# Patient Record
Sex: Female | Born: 1960 | Race: White | Hispanic: No | Marital: Married | Smoking: Never smoker
Health system: Southern US, Community
[De-identification: ages and names within clinical notes are randomized; demographics above are authoritative.]

## PROBLEM LIST (undated history)

## (undated) DIAGNOSIS — M1990 Unspecified osteoarthritis, unspecified site: Secondary | ICD-10-CM

## (undated) DIAGNOSIS — N719 Inflammatory disease of uterus, unspecified: Secondary | ICD-10-CM

## (undated) DIAGNOSIS — E063 Autoimmune thyroiditis: Secondary | ICD-10-CM

## (undated) DIAGNOSIS — K219 Gastro-esophageal reflux disease without esophagitis: Secondary | ICD-10-CM

## (undated) DIAGNOSIS — Z803 Family history of malignant neoplasm of breast: Secondary | ICD-10-CM

## (undated) DIAGNOSIS — R638 Other symptoms and signs concerning food and fluid intake: Secondary | ICD-10-CM

## (undated) DIAGNOSIS — G8929 Other chronic pain: Secondary | ICD-10-CM

## (undated) DIAGNOSIS — M1711 Unilateral primary osteoarthritis, right knee: Secondary | ICD-10-CM

## (undated) DIAGNOSIS — O23599 Infection of other part of genital tract in pregnancy, unspecified trimester: Secondary | ICD-10-CM

## (undated) DIAGNOSIS — N952 Postmenopausal atrophic vaginitis: Secondary | ICD-10-CM

## (undated) DIAGNOSIS — M722 Plantar fascial fibromatosis: Secondary | ICD-10-CM

## (undated) DIAGNOSIS — R519 Headache, unspecified: Secondary | ICD-10-CM

## (undated) DIAGNOSIS — M1712 Unilateral primary osteoarthritis, left knee: Secondary | ICD-10-CM

## (undated) DIAGNOSIS — E039 Hypothyroidism, unspecified: Secondary | ICD-10-CM

## (undated) DIAGNOSIS — B001 Herpesviral vesicular dermatitis: Secondary | ICD-10-CM

## (undated) DIAGNOSIS — R51 Headache: Secondary | ICD-10-CM

## (undated) DIAGNOSIS — L739 Follicular disorder, unspecified: Secondary | ICD-10-CM

## (undated) DIAGNOSIS — E049 Nontoxic goiter, unspecified: Secondary | ICD-10-CM

## (undated) DIAGNOSIS — R011 Cardiac murmur, unspecified: Secondary | ICD-10-CM

## (undated) DIAGNOSIS — K5900 Constipation, unspecified: Secondary | ICD-10-CM

## (undated) DIAGNOSIS — Z780 Asymptomatic menopausal state: Secondary | ICD-10-CM

## (undated) HISTORY — DX: Autoimmune thyroiditis: E06.3

## (undated) HISTORY — DX: Plantar fascial fibromatosis: M72.2

## (undated) HISTORY — DX: Herpesviral vesicular dermatitis: B00.1

## (undated) HISTORY — DX: Other chronic pain: G89.29

## (undated) HISTORY — DX: Gastro-esophageal reflux disease without esophagitis: K21.9

## (undated) HISTORY — DX: Family history of malignant neoplasm of breast: Z80.3

## (undated) HISTORY — PX: GYNECOLOGIC CRYOSURGERY: SHX857

## (undated) HISTORY — DX: Unspecified osteoarthritis, unspecified site: M19.90

## (undated) HISTORY — DX: Nontoxic goiter, unspecified: E04.9

## (undated) HISTORY — DX: Follicular disorder, unspecified: L73.9

## (undated) HISTORY — DX: Headache, unspecified: R51.9

## (undated) HISTORY — DX: Headache: R51

## (undated) HISTORY — DX: Constipation, unspecified: K59.00

## (undated) HISTORY — PX: TONSILLECTOMY: SUR1361

## (undated) HISTORY — DX: Other symptoms and signs concerning food and fluid intake: R63.8

## (undated) HISTORY — DX: Asymptomatic menopausal state: Z78.0

## (undated) HISTORY — DX: Postmenopausal atrophic vaginitis: N95.2

---

## 2005-06-17 ENCOUNTER — Ambulatory Visit: Payer: Self-pay | Admitting: Unknown Physician Specialty

## 2006-08-18 ENCOUNTER — Ambulatory Visit: Payer: Self-pay | Admitting: Unknown Physician Specialty

## 2007-11-10 ENCOUNTER — Ambulatory Visit: Payer: Self-pay | Admitting: Unknown Physician Specialty

## 2008-05-18 ENCOUNTER — Ambulatory Visit: Payer: Self-pay | Admitting: Internal Medicine

## 2008-11-12 ENCOUNTER — Ambulatory Visit: Payer: Self-pay | Admitting: Unknown Physician Specialty

## 2009-12-09 ENCOUNTER — Ambulatory Visit: Payer: Self-pay | Admitting: Unknown Physician Specialty

## 2009-12-23 ENCOUNTER — Ambulatory Visit: Payer: Self-pay | Admitting: Unknown Physician Specialty

## 2011-01-15 ENCOUNTER — Ambulatory Visit: Payer: Self-pay | Admitting: Obstetrics and Gynecology

## 2012-01-19 ENCOUNTER — Ambulatory Visit: Payer: Self-pay | Admitting: Obstetrics and Gynecology

## 2013-01-19 ENCOUNTER — Ambulatory Visit: Payer: Self-pay | Admitting: Obstetrics and Gynecology

## 2013-12-12 LAB — HM PAP SMEAR: HM Pap smear: NEGATIVE

## 2014-01-22 ENCOUNTER — Ambulatory Visit: Payer: Self-pay | Admitting: Obstetrics and Gynecology

## 2014-12-28 ENCOUNTER — Other Ambulatory Visit: Payer: Self-pay | Admitting: Obstetrics and Gynecology

## 2014-12-28 DIAGNOSIS — Z1239 Encounter for other screening for malignant neoplasm of breast: Secondary | ICD-10-CM

## 2015-01-29 ENCOUNTER — Other Ambulatory Visit: Payer: Self-pay | Admitting: Obstetrics and Gynecology

## 2015-01-29 ENCOUNTER — Ambulatory Visit
Admission: RE | Admit: 2015-01-29 | Discharge: 2015-01-29 | Disposition: A | Discharge status: Home / Self Care | Payer: BLUE CROSS/BLUE SHIELD | Source: Ambulatory Visit | Attending: Obstetrics and Gynecology | Admitting: Obstetrics and Gynecology

## 2015-01-29 DIAGNOSIS — Z1231 Encounter for screening mammogram for malignant neoplasm of breast: Secondary | ICD-10-CM | POA: Insufficient documentation

## 2015-01-29 DIAGNOSIS — Z1239 Encounter for other screening for malignant neoplasm of breast: Secondary | ICD-10-CM

## 2015-01-29 LAB — HM MAMMOGRAPHY

## 2015-02-22 ENCOUNTER — Ambulatory Visit: Admission: RE | Admit: 2015-02-22 | Payer: 59 | Source: Ambulatory Visit | Admitting: Unknown Physician Specialty

## 2015-02-22 ENCOUNTER — Encounter: Admission: RE | Payer: Self-pay | Source: Ambulatory Visit

## 2015-02-22 SURGERY — COLONOSCOPY
Anesthesia: General

## 2015-03-11 ENCOUNTER — Telehealth: Payer: Self-pay | Admitting: Obstetrics and Gynecology

## 2015-03-11 NOTE — Telephone Encounter (Signed)
PT NEEDS REFILL ON NYSTATIN & TRIAMCINOLONE ACETONIDE OINTMENT.  [facility]

## 2015-03-15 MED ORDER — NYSTATIN-TRIAMCINOLONE 100000-0.1 UNIT/GM-% EX CREA: 1.0000 "application " | TOPICAL_CREAM | Freq: Two times a day (BID) | CUTANEOUS | Status: DC

## 2015-03-15 NOTE — Telephone Encounter (Signed)
Pt aware med erx. 

## 2015-03-21 ENCOUNTER — Other Ambulatory Visit: Payer: Self-pay

## 2015-03-21 DIAGNOSIS — R21 Rash and other nonspecific skin eruption: Secondary | ICD-10-CM

## 2015-03-21 MED ORDER — TRIAMCINOLONE ACETONIDE 0.1 % EX OINT: 1.0000 "application " | TOPICAL_OINTMENT | Freq: Two times a day (BID) | CUTANEOUS | Status: DC

## 2015-03-21 MED ORDER — NYSTATIN 100000 UNIT/GM EX OINT: 1.0000 "application " | TOPICAL_OINTMENT | Freq: Two times a day (BID) | CUTANEOUS | Status: DC

## 2015-03-21 NOTE — Telephone Encounter (Signed)
Nystatin-triamcinolone needed a PA. Sent rx in separately.

## 2015-12-24 ENCOUNTER — Encounter: Payer: Self-pay | Admitting: Obstetrics and Gynecology

## 2015-12-24 ENCOUNTER — Ambulatory Visit (INDEPENDENT_AMBULATORY_CARE_PROVIDER_SITE_OTHER): Payer: 59 | Admitting: Obstetrics and Gynecology

## 2015-12-24 VITALS — BP 104/71 | HR 79 | Ht 66.0 in | Wt 180.9 lb

## 2015-12-24 DIAGNOSIS — Z0000 Encounter for general adult medical examination without abnormal findings: Secondary | ICD-10-CM

## 2015-12-24 DIAGNOSIS — Z1239 Encounter for other screening for malignant neoplasm of breast: Secondary | ICD-10-CM

## 2015-12-24 DIAGNOSIS — Z87410 Personal history of cervical dysplasia: Secondary | ICD-10-CM

## 2015-12-24 DIAGNOSIS — Z780 Asymptomatic menopausal state: Secondary | ICD-10-CM | POA: Diagnosis not present

## 2015-12-24 DIAGNOSIS — R638 Other symptoms and signs concerning food and fluid intake: Secondary | ICD-10-CM | POA: Diagnosis not present

## 2015-12-24 DIAGNOSIS — Z01419 Encounter for gynecological examination (general) (routine) without abnormal findings: Secondary | ICD-10-CM

## 2015-12-24 DIAGNOSIS — N952 Postmenopausal atrophic vaginitis: Secondary | ICD-10-CM

## 2015-12-24 NOTE — Addendum Note (Signed)
Addended by: Marchelle FolksMILLER, Brinlyn Cena G on: 12/24/2015 09:27 AM   Modules accepted: Orders

## 2015-12-24 NOTE — Patient Instructions (Signed)
1. Pap smear is done 2. Mammogram is ordered 3. Continue with healthy eating and exercise with weight loss 4. Encourage calcium with vitamin D supplementation 5. Return in 1 year

## 2015-12-24 NOTE — Progress Notes (Signed)
Patient ID: Adrienne Horne, female   DOB: August 18, 1961, 55 y.o.   MRN: [id number] ANNUAL PREVENTATIVE CARE GYN  ENCOUNTER NOTE  Subjective:       Adrienne Horne is a 55 y.o. [phone] female here for a routine annual gynecologic exam.  Current complaints: 1.  none   History of vaginal atrophy; not currently use lubricants. Patient does report some warmth in her left lower leg when walking on the anterior medial aspect. No redness or swelling is appreciated. No pain with dorsiflexion of fluid is noted. Patient desires repeat Pap smear testing this year because of strong family history of cancer. Colonoscopy in 2016-normal  Gynecologic History No LMP recorded. Patient is postmenopausal. Contraception: vasectomy Last Pap: 2015 neg/neg. Results were: normal Last mammogram: 01/2015 birad 1. Results were: normal  Obstetric History OB History  Gravida Para Term Preterm AB SAB TAB Ectopic Multiple Living  # Outcome Date GA Lbr Len/2nd Weight Sex Delivery Anes PTL Lv  2 Term    7 lb 8 oz (3.402 kg) F Vag-Spont   Y     Complications: Endometritis affecting pregnancy  1 Term    5 lb 9.6 oz (2.54 kg) F Vag-Spont   Y      Past Medical History  Diagnosis Date  . Chronic left-sided headaches     UNCLEAR ETIOLOGY  . GERD (gastroesophageal reflux disease)   . Fever blister   . Plantar fasciitis   . Folliculitis   . Vaginal atrophy   . Menopause   . Family history of breast cancer   . Goiter   . Hashimoto's disease   . Constipation   . Increased BMI     Past Surgical History  Procedure Laterality Date  . Tonsillectomy    . Gynecologic cryosurgery      Current Outpatient Prescriptions on File Prior to Visit  Medication Sig Dispense Refill  . fluticasone (FLONASE) 50 MCG/ACT nasal spray Place 2 sprays into the nose daily as needed for rhinitis.     Marland Kitchen HYDROcodone-ibuprofen (VICOPROFEN) 7.5-200 MG per tablet Take 1 tablet by mouth 2 (two) times daily as needed for  severe pain.     Marland Kitchen ibuprofen (ADVIL,MOTRIN) 200 MG tablet Take 200 tablets by mouth daily.    Marland Kitchen nystatin ointment (MYCOSTATIN) Apply 1 application topically 2 (two) times daily. 30 g 0  . nystatin-triamcinolone (MYCOLOG II) cream Apply 1 application topically 2 (two) times daily. 30 g 0  . pantoprazole (PROTONIX) 40 MG tablet Take 1 tablet by mouth daily.    . polyethylene glycol powder (GLYCOLAX/MIRALAX) powder Take 1 Container by mouth once.    . triamcinolone ointment (KENALOG) 0.1 % Apply 1 application topically 2 (two) times daily. 30 g 0  . valACYclovir (VALTREX) 1000 MG tablet Take 2 tablets by mouth 2 (two) times daily as needed (cold sores).      No current facility-administered medications on file prior to visit.    Allergies  Allergen Reactions  . Penicillin G Rash    In high doses.  Ok to take amoxicillin    Social History   Social History  . Marital Status: Married    Spouse Name: N/A  . Number of Children: N/A  . Years of Education: N/A   Occupational History  . Not on file.   Social History Main Topics  . Smoking status: Never Smoker   . Smokeless tobacco: Not on file  .  Alcohol Use: No  . Drug Use: No  . Sexual Activity: Not Currently    Birth Control/ Protection: Other-see comments     Comment: VASECTOMY   Other Topics Concern  . Not on file   Social History Narrative    Family History  Problem Relation Age of Onset  . Breast cancer Mother [id number]  . Heart disease Mother   . Pancreatic cancer Father   . Hypothyroidism Sister   . Diabetes Neg Hx   . Colon cancer Neg Hx   . Ovarian cancer Neg Hx     The following portions of the patient's history were reviewed and updated as appropriate: allergies, current medications, past family history, past medical history, past social history, past surgical history and problem list.  Review of Systems ROS Review of Systems - General ROS: negative for - chills, fatigue, fever, hot flashes, night sweats, weight  gain or weight loss Psychological ROS: negative for - anxiety, decreased libido, depression, mood swings, physical abuse or sexual abuse Ophthalmic ROS: negative for - blurry vision, eye pain or loss of vision ENT ROS: negative for - headaches, hearing change, visual changes or vocal changes Allergy and Immunology ROS: negative for - hives, itchy/watery eyes or seasonal allergies Hematological and Lymphatic ROS: negative for - bleeding problems, bruising, swollen lymph nodes or weight loss Endocrine ROS: negative for - galactorrhea, hair pattern changes, hot flashes, malaise/lethargy, mood swings, palpitations, polydipsia/polyuria, skin changes, temperature intolerance or unexpected weight changes Breast ROS: negative for - new or changing breast lumps or nipple discharge Respiratory ROS: negative for - cough or shortness of breath Cardiovascular ROS: negative for - chest pain, irregular heartbeat, palpitations or shortness of breath Gastrointestinal ROS: no abdominal pain, change in bowel habits, or black or bloody stools Genito-Urinary ROS: no dysuria, trouble voiding, or hematuria Musculoskeletal ROS: negative for - joint pain or joint stiffness Neurological ROS: negative for - bowel and bladder control changes Dermatological ROS: negative for rash and skin lesion changes   Objective:   BP 104/71 mmHg  Pulse 79  Ht 5\' 6"  (1.676 m)  Wt 180 lb 14.4 oz (82.056 kg)  BMI 29.21 kg/m2 CONSTITUTIONAL: Well-developed, well-nourished female in no acute distress.  PSYCHIATRIC: Normal mood and affect. Normal behavior. Normal judgment and thought content. NEUROLGIC: Alert and oriented to person, place, and time. Normal muscle tone coordination. No cranial nerve deficit noted. HENT:  Normocephalic, atraumatic, External right and left ear normal. Oropharynx is clear and moist EYES: Conjunctivae and EOM are normal. No scleral icterus.  NECK: Normal range of motion, supple, no masses.  Normal thyroid.   SKIN: Skin is warm and dry. No rash noted. Not diaphoretic. No erythema. No pallor. CARDIOVASCULAR: Normal heart rate noted, regular rhythm, no murmur. RESPIRATORY: Clear to auscultation bilaterally. Effort and breath sounds normal, no problems with respiration noted. BREASTS: Symmetric in size. No masses, skin changes, nipple drainage, or lymphadenopathy. ABDOMEN: Soft, normal bowel sounds, no distention noted.  No tenderness, rebound or guarding.  BLADDER: Normal PELVIC:  External Genitalia: Normal  BUS: Normal  Vagina: Moderate atrophy  Cervix: Normal; no lesions  Uterus: Normal; midplane, normal size and shape, mobile, nontender  Adnexa: Normal  RV: External Exam NormaI, No Rectal Masses and Normal Sphincter tone  MUSCULOSKELETAL: Normal range of motion. No tenderness.  No cyanosis, clubbing, or edema.  2+ distal pulses. LYMPHATIC: No Axillary, Supraclavicular, or Inguinal Adenopathy.    Assessment:   Annual gynecologic examination 55 y.o. Contraception: vasectomy bmi-29 Menopause, asymptomatic Family history  of breast cancer Vaginal atrophy, asymptomatic  Plan:  Pap: Not needed Mammogram: Ordered Stool Guaiac Testing:  Not Indicated Labs: thru pcp Routine preventative health maintenance measures emphasized: Exercise/Diet/Weight control, Tobacco Warnings and Alcohol/Substance use risks Calcium with vitamin D supplementation recommended; consider Tums Return to Clinic - 1 Year   Crystal Wallace, CMA  Herold Harms, MD  Note: This dictation was prepared with Dragon dictation along with smaller phrase technology. Any transcriptional errors that result from this process are unintentional.

## 2015-12-27 LAB — PAP IG W/ RFLX HPV ASCU: PAP Smear Comment: 0

## 2016-01-30 ENCOUNTER — Other Ambulatory Visit: Payer: Self-pay | Admitting: Obstetrics and Gynecology

## 2016-01-30 ENCOUNTER — Ambulatory Visit
Admission: RE | Admit: 2016-01-30 | Discharge: 2016-01-30 | Disposition: A | Discharge status: Home / Self Care | Payer: 59 | Source: Ambulatory Visit | Attending: Obstetrics and Gynecology | Admitting: Obstetrics and Gynecology

## 2016-01-30 DIAGNOSIS — Z1239 Encounter for other screening for malignant neoplasm of breast: Secondary | ICD-10-CM

## 2016-01-30 DIAGNOSIS — Z1231 Encounter for screening mammogram for malignant neoplasm of breast: Secondary | ICD-10-CM | POA: Diagnosis not present

## 2016-12-28 ENCOUNTER — Other Ambulatory Visit: Payer: Self-pay | Admitting: Obstetrics and Gynecology

## 2017-01-07 ENCOUNTER — Encounter: Payer: 59 | Admitting: Obstetrics and Gynecology

## 2017-01-11 NOTE — Progress Notes (Signed)
Patient ID: Adrienne Horne, female   DOB: 1960-11-04, 56 y.o.   MRN: [id number] ANNUAL PREVENTATIVE CARE GYN  ENCOUNTER NOTE  Subjective:       Adrienne Horne is a 56 y.o. [phone] female here for a routine annual gynecologic exam.  Current complaints: 1.  none   Colonoscopy in 2016-normal  Patient reports normal bowel and bladder function. She does note some ongoing vaginal dryness. She is going to consider vaginal estrogen therapy. She does have concerns about the estrogen considering her mom had been diagnosed with breast cancer.  Gynecologic History No LMP recorded. Patient is postmenopausal. Contraception: vasectomy Last Pap: 2015 neg/neg. Results were: normal Last mammogram: 01/2016 birad 1. Results were: normal  Obstetric History OB History  Gravida Para Term Preterm AB Living  SAB TAB Ectopic Multiple Live Births          2    # Outcome Date GA Lbr Len/2nd Weight Sex Delivery Anes PTL Lv  2 Term    7 lb 8 oz (3.402 kg) F Vag-Spont   LIV     Complications: Endometritis affecting pregnancy  1 Term    5 lb 9.6 oz (2.54 kg) F Vag-Spont   LIV      Past Medical History:  Diagnosis Date  . Chronic left-sided headaches    UNCLEAR ETIOLOGY  . Constipation   . Family history of breast cancer   . Fever blister   . Folliculitis   . GERD (gastroesophageal reflux disease)   . Goiter   . Hashimoto's disease   . Increased BMI   . Menopause   . Plantar fasciitis   . Vaginal atrophy     Past Surgical History:  Procedure Laterality Date  . GYNECOLOGIC CRYOSURGERY    . TONSILLECTOMY      Current Outpatient Prescriptions on File Prior to Visit  Medication Sig Dispense Refill  . fluticasone (FLONASE) 50 MCG/ACT nasal spray Place 2 sprays into the nose daily as needed for rhinitis.     Marland Kitchen HYDROcodone-ibuprofen (VICOPROFEN) 7.5-200 MG per tablet Take 1 tablet by mouth 2 (two) times daily as needed for severe pain.     Marland Kitchen ibuprofen (ADVIL,MOTRIN) 200 MG tablet  Take 200 tablets by mouth daily.    Marland Kitchen nystatin ointment (MYCOSTATIN) Apply 1 application topically 2 (two) times daily. 30 g 0  . nystatin-triamcinolone (MYCOLOG II) cream Apply 1 application topically 2 (two) times daily. 30 g 0  . pantoprazole (PROTONIX) 40 MG tablet Take 1 tablet by mouth daily.    . polyethylene glycol powder (GLYCOLAX/MIRALAX) powder Take 1 Container by mouth once.    . triamcinolone ointment (KENALOG) 0.1 % Apply 1 application topically 2 (two) times daily. 30 g 0  . valACYclovir (VALTREX) 1000 MG tablet Take 2 tablets by mouth 2 (two) times daily as needed (cold sores).      No current facility-administered medications on file prior to visit.     Allergies  Allergen Reactions  . Penicillin G Rash    In high doses.  Ok to take amoxicillin    Social History   Social History  . Marital status: Married    Spouse name: N/A  . Number of children: N/A  . Years of education: N/A   Occupational History  . Not on file.   Social History Main Topics  . Smoking status: Never Smoker  . Smokeless tobacco: Not on file  . Alcohol use No  .  Drug use: No  . Sexual activity: Not Currently    Birth control/ protection: Other-see comments     Comment: VASECTOMY   Other Topics Concern  . Not on file   Social History Narrative  . No narrative on file    Family History  Problem Relation Age of Onset  . Breast cancer Mother 77  . Heart disease Mother   . Pancreatic cancer Father   . Hypothyroidism Sister   . Diabetes Neg Hx   . Colon cancer Neg Hx   . Ovarian cancer Neg Hx     The following portions of the patient's history were reviewed and updated as appropriate: allergies, current medications, past family history, past medical history, past social history, past surgical history and problem list.  Review of Systems Review of Systems  Constitutional: Negative for chills, diaphoresis, fever, malaise/fatigue and weight loss.  HENT: Negative for congestion,  hearing loss, sinus pain and sore throat.   Eyes: Negative for blurred vision, double vision, photophobia, pain, discharge and redness.  Respiratory: Negative for cough, hemoptysis, shortness of breath and wheezing.   Cardiovascular: Positive for leg swelling. Negative for chest pain, palpitations and claudication.  Gastrointestinal: Negative for abdominal pain, blood in stool, constipation, diarrhea, nausea and vomiting.  Genitourinary: Negative for dysuria, flank pain, frequency, hematuria and urgency.  Musculoskeletal: Negative for back pain, myalgias and neck pain.  Skin: Negative for itching and rash.  Neurological: Positive for headaches. Negative for dizziness, sensory change, focal weakness, seizures, loss of consciousness and weakness.    Objective:   BP 116/72   Pulse 70   Ht  (1.676 m)   Wt 177 lb 11.2 oz (80.6 kg)   BMI 28.68 kg/m  CONSTITUTIONAL: Well-developed, well-nourished female in no acute distress.  PSYCHIATRIC: Normal mood and affect. Normal behavior. Normal judgment and thought content. NEUROLGIC: Alert and oriented to person, place, and time. Normal muscle tone coordination. No cranial nerve deficit noted. HENT:  Normocephalic, atraumatic, External right and left ear normal. Oropharynx is clear and moist EYES: Conjunctivae and EOM are normal. No scleral icterus.  NECK: Normal range of motion, supple, no masses.  Normal thyroid.  SKIN: Skin is warm and dry. No rash noted. Not diaphoretic. No erythema. No pallor. CARDIOVASCULAR: Normal heart rate noted, regular rhythm, no murmur. RESPIRATORY: Clear to auscultation bilaterally. Effort and breath sounds normal, no problems with respiration noted. BREASTS: Symmetric in size. No masses, skin changes, nipple drainage, or lymphadenopathy. Pea-size nodule 1 o'clock adjacent to areola (unchanged from last visit) ABDOMEN: Soft, normal bowel sounds, no distention noted.  No tenderness, rebound or guarding.  BLADDER:  Normal PELVIC:  External Genitalia: Normal  BUS: Normal  Vagina: Moderate atrophy  Cervix: Normal; no lesions  Uterus: Normal; midplane, normal size and shape, mobile, nontender  Adnexa: Normal  RV: External Exam NormaI, No Rectal Masses and Normal Sphincter tone  MUSCULOSKELETAL: Normal range of motion. No tenderness.  No cyanosis, clubbing, or edema.  2+ distal pulses. LYMPHATIC: No Axillary, Supraclavicular, or Inguinal Adenopathy.    Assessment:   Annual gynecologic examination 56 y.o. Contraception: vasectomy BVMI-28 Menopause, asymptomatic Family history of breast cancer Vaginal atrophy,Mildly symptomatic  Plan:  Pap: pap w/hpv Mammogram: Ordered Stool Guaiac Testing: ordered Labs: vit d fbs a1c lipid a1c Routine preventative health maintenance measures emphasized: Exercise/Diet/Weight control, Tobacco Warnings and Alcohol/Substance use risks Calcium with vitamin D supplementation recommended; consider Tums Trial of [place] vaginal suppositories Return to Clinic - 1 [address] [place], [place]  Herold Harms, MD   Note: This dictation was prepared with Dragon dictation along with smaller phrase technology. Any transcriptional errors that result from this process are unintentional.

## 2017-01-13 ENCOUNTER — Ambulatory Visit (INDEPENDENT_AMBULATORY_CARE_PROVIDER_SITE_OTHER): Payer: BLUE CROSS/BLUE SHIELD | Admitting: Obstetrics and Gynecology

## 2017-01-13 ENCOUNTER — Encounter: Payer: Self-pay | Admitting: Obstetrics and Gynecology

## 2017-01-13 VITALS — BP 116/72 | HR 70 | Ht 66.0 in | Wt 177.7 lb

## 2017-01-13 DIAGNOSIS — Z780 Asymptomatic menopausal state: Secondary | ICD-10-CM

## 2017-01-13 DIAGNOSIS — N952 Postmenopausal atrophic vaginitis: Secondary | ICD-10-CM | POA: Diagnosis not present

## 2017-01-13 DIAGNOSIS — Z803 Family history of malignant neoplasm of breast: Secondary | ICD-10-CM

## 2017-01-13 DIAGNOSIS — Z1239 Encounter for other screening for malignant neoplasm of breast: Secondary | ICD-10-CM

## 2017-01-13 DIAGNOSIS — Z1211 Encounter for screening for malignant neoplasm of colon: Secondary | ICD-10-CM

## 2017-01-13 DIAGNOSIS — Z1231 Encounter for screening mammogram for malignant neoplasm of breast: Secondary | ICD-10-CM | POA: Diagnosis not present

## 2017-01-13 DIAGNOSIS — R638 Other symptoms and signs concerning food and fluid intake: Secondary | ICD-10-CM | POA: Diagnosis not present

## 2017-01-13 DIAGNOSIS — Z01419 Encounter for gynecological examination (general) (routine) without abnormal findings: Secondary | ICD-10-CM

## 2017-01-13 DIAGNOSIS — Z87410 Personal history of cervical dysplasia: Secondary | ICD-10-CM

## 2017-01-13 NOTE — Patient Instructions (Signed)
1. Pap smear is done 2. Mammogram is ordered 3. Stool guaiac cards are given for colon cancer screening 4. Screening labs are ordered 5. Trial of Intrarosa suppositories for vaginal atrophy and dyspareunia are ordered 6. Continue with healthy eating, exercise 7. Return in 1 year for annual exam   Health Maintenance for Postmenopausal Women Menopause is a normal process in which your reproductive ability comes to an end. This process happens gradually over a span of months to years, usually between the ages of 27 and 39. Menopause is complete when you have missed 12 consecutive menstrual periods. It is important to talk with your health care provider about some of the most common conditions that affect postmenopausal women, such as heart disease, cancer, and bone loss (osteoporosis). Adopting a healthy lifestyle and getting preventive care can help to promote your health and wellness. Those actions can also lower your chances of developing some of these common conditions. What should I know about menopause? During menopause, you may experience a number of symptoms, such as:  Moderate-to-severe hot flashes.  Night sweats.  Decrease in sex drive.  Mood swings.  Headaches.  Tiredness.  Irritability.  Memory problems.  Insomnia. Choosing to treat or not to treat menopausal changes is an individual decision that you make with your health care provider. What should I know about hormone replacement therapy and supplements? Hormone therapy products are effective for treating symptoms that are associated with menopause, such as hot flashes and night sweats. Hormone replacement carries certain risks, especially as you become older. If you are thinking about using estrogen or estrogen with progestin treatments, discuss the benefits and risks with your health care provider. What should I know about heart disease and stroke? Heart disease, heart attack, and stroke become more likely as you age.  This may be due, in part, to the hormonal changes that your body experiences during menopause. These can affect how your body processes dietary fats, triglycerides, and cholesterol. Heart attack and stroke are both medical emergencies. There are many things that you can do to help prevent heart disease and stroke:  Have your blood pressure checked at least every 1-2 years. High blood pressure causes heart disease and increases the risk of stroke.  If you are 28-19 years old, ask your health care provider if you should take aspirin to prevent a heart attack or a stroke.  Do not use any tobacco products, including cigarettes, chewing tobacco, or electronic cigarettes. If you need help quitting, ask your health care provider.  It is important to eat a healthy diet and maintain a healthy weight.  Be sure to include plenty of vegetables, fruits, low-fat dairy products, and lean protein.  Avoid eating foods that are high in solid fats, added sugars, or salt (sodium).  Get regular exercise. This is one of the most important things that you can do for your health.  Try to exercise for at least 150 minutes each week. The type of exercise that you do should increase your heart rate and make you sweat. This is known as moderate-intensity exercise.  Try to do strengthening exercises at least twice each week. Do these in addition to the moderate-intensity exercise.  Know your numbers.Ask your health care provider to check your cholesterol and your blood glucose. Continue to have your blood tested as directed by your health care provider. What should I know about cancer screening? There are several types of cancer. Take the following steps to reduce your risk and to catch  any cancer development as early as possible. Breast Cancer  Practice breast self-awareness.  This means understanding how your breasts normally appear and feel.  It also means doing regular breast self-exams. Let your health care  provider know about any changes, no matter how small.  If you are 17 or older, have a clinician do a breast exam (clinical breast exam or CBE) every year. Depending on your age, family history, and medical history, it may be recommended that you also have a yearly breast X-ray (mammogram).  If you have a family history of breast cancer, talk with your health care provider about genetic screening.  If you are at high risk for breast cancer, talk with your health care provider about having an MRI and a mammogram every year.  Breast cancer (BRCA) gene test is recommended for women who have family members with BRCA-related cancers. Results of the assessment will determine the need for genetic counseling and BRCA1 and for BRCA2 testing. BRCA-related cancers include these types:  Breast. This occurs in males or females.  Ovarian.  Tubal. This may also be called fallopian tube cancer.  Cancer of the abdominal or pelvic lining (peritoneal cancer).  Prostate.  Pancreatic. Cervical, Uterine, and Ovarian Cancer  Your health care provider may recommend that you be screened regularly for cancer of the pelvic organs. These include your ovaries, uterus, and vagina. This screening involves a pelvic exam, which includes checking for microscopic changes to the surface of your cervix (Pap test).  For women ages 21-65, health care providers may recommend a pelvic exam and a Pap test every three years. For women ages 29-65, they may recommend the Pap test and pelvic exam, combined with testing for human papilloma virus (HPV), every five years. Some types of HPV increase your risk of cervical cancer. Testing for HPV may also be done on women of any age who have unclear Pap test results.  Other health care providers may not recommend any screening for nonpregnant women who are considered low risk for pelvic cancer and have no symptoms. Ask your health care provider if a screening pelvic exam is right for  you.  If you have had past treatment for cervical cancer or a condition that could lead to cancer, you need Pap tests and screening for cancer for at least 20 years after your treatment. If Pap tests have been discontinued for you, your risk factors (such as having a new sexual partner) need to be reassessed to determine if you should start having screenings again. Some women have medical problems that increase the chance of getting cervical cancer. In these cases, your health care provider may recommend that you have screening and Pap tests more often.  If you have a family history of uterine cancer or ovarian cancer, talk with your health care provider about genetic screening.  If you have vaginal bleeding after reaching menopause, tell your health care provider.  There are currently no reliable tests available to screen for ovarian cancer. Lung Cancer  Lung cancer screening is recommended for adults 51-75 years old who are at high risk for lung cancer because of a history of smoking. A yearly low-dose CT scan of the lungs is recommended if you:  Currently smoke.  Have a history of at least 30 pack-years of smoking and you currently smoke or have quit within the past 15 years. A pack-year is smoking an average of one pack of cigarettes per day for one year. Yearly screening should:  Continue until  it has been 15 years since you quit.  Stop if you develop a health problem that would prevent you from having lung cancer treatment. Colorectal Cancer  This type of cancer can be detected and can often be prevented.  Routine colorectal cancer screening usually begins at age 53 and continues through age 26.  If you have risk factors for colon cancer, your health care provider may recommend that you be screened at an earlier age.  If you have a family history of colorectal cancer, talk with your health care provider about genetic screening.  Your health care provider may also recommend using  home test kits to check for hidden blood in your stool.  A small camera at the end of a tube can be used to examine your colon directly (sigmoidoscopy or colonoscopy). This is done to check for the earliest forms of colorectal cancer.  Direct examination of the colon should be repeated every 5-10 years until age 54. However, if early forms of precancerous polyps or small growths are found or if you have a family history or genetic risk for colorectal cancer, you may need to be screened more often. Skin Cancer  Check your skin from head to toe regularly.  Monitor any moles. Be sure to tell your health care provider:  About any new moles or changes in moles, especially if there is a change in a mole's shape or color.  If you have a mole that is larger than the size of a pencil eraser.  If any of your family members has a history of skin cancer, especially at a young age, talk with your health care provider about genetic screening.  Always use sunscreen. Apply sunscreen liberally and repeatedly throughout the day.  Whenever you are outside, protect yourself by wearing long sleeves, pants, a wide-brimmed hat, and sunglasses. What should I know about osteoporosis? Osteoporosis is a condition in which bone destruction happens more quickly than new bone creation. After menopause, you may be at an increased risk for osteoporosis. To help prevent osteoporosis or the bone fractures that can happen because of osteoporosis, the following is recommended:  If you are 27-38 years old, get at least 1,000 mg of calcium and at least 600 mg of vitamin D per day.  If you are older than age 80 but younger than age 31, get at least 1,200 mg of calcium and at least 600 mg of vitamin D per day.  If you are older than age 36, get at least 1,200 mg of calcium and at least 800 mg of vitamin D per day. Smoking and excessive alcohol intake increase the risk of osteoporosis. Eat foods that are rich in calcium and  vitamin D, and do weight-bearing exercises several times each week as directed by your health care provider. What should I know about how menopause affects my mental health? Depression may occur at any age, but it is more common as you become older. Common symptoms of depression include:  Low or sad mood.  Changes in sleep patterns.  Changes in appetite or eating patterns.  Feeling an overall lack of motivation or enjoyment of activities that you previously enjoyed.  Frequent crying spells. Talk with your health care provider if you think that you are experiencing depression. What should I know about immunizations? It is important that you get and maintain your immunizations. These include:  Tetanus, diphtheria, and pertussis (Tdap) booster vaccine.  Influenza every year before the flu season begins.  Pneumonia vaccine.  Shingles vaccine. Your health care provider may also recommend other immunizations. This information is not intended to replace advice given to you by your health care provider. Make sure you discuss any questions you have with your health care provider. Document Released: 10/23/2005 Document Revised: 03/20/2016 Document Reviewed: 06/04/2015 Elsevier Interactive Patient Education  2017 [employer].

## 2017-01-14 LAB — VITAMIN D 25 HYDROXY (VIT D DEFICIENCY, FRACTURES): Vit D, 25-Hydroxy: 40 ng/mL (ref 30.0–100.0)

## 2017-01-14 LAB — LIPID PANEL
Chol/HDL Ratio: 3.7 ratio (ref 0.0–4.4)
Cholesterol, Total: 177 mg/dL (ref 100–199)
HDL: 48 mg/dL (ref >39)
LDL Calculated: 114 mg/dL — ABNORMAL HIGH (ref 0–99)
Triglycerides: 76 mg/dL (ref 0–149)
VLDL CHOLESTEROL CAL: 15 mg/dL (ref 5–40)

## 2017-01-14 LAB — HEMOGLOBIN A1C
ESTIMATED AVERAGE GLUCOSE: 114 mg/dL
Hgb A1c MFr Bld: 5.6 % (ref 4.8–5.6)

## 2017-01-14 LAB — TSH: TSH: 5.09 u[IU]/mL — ABNORMAL HIGH (ref 0.450–4.500)

## 2017-01-14 LAB — GLUCOSE, RANDOM: Glucose: 102 mg/dL — ABNORMAL HIGH (ref 65–99)

## 2017-01-15 LAB — PAP IG AND HPV HIGH-RISK
HPV, high-risk: NEGATIVE
PAP Smear Comment: 0

## 2017-01-18 ENCOUNTER — Other Ambulatory Visit: Payer: Self-pay

## 2017-01-18 DIAGNOSIS — R7989 Other specified abnormal findings of blood chemistry: Secondary | ICD-10-CM

## 2017-01-22 ENCOUNTER — Other Ambulatory Visit: Payer: BLUE CROSS/BLUE SHIELD

## 2017-01-22 DIAGNOSIS — R946 Abnormal results of thyroid function studies: Secondary | ICD-10-CM | POA: Diagnosis not present

## 2017-01-22 DIAGNOSIS — R7989 Other specified abnormal findings of blood chemistry: Secondary | ICD-10-CM

## 2017-01-23 LAB — THYROID PANEL WITH TSH
Free Thyroxine Index: 1.5 (ref 1.2–4.9)
T3 Uptake Ratio: 29 % (ref 24–39)
T4 TOTAL: 5.1 ug/dL (ref 4.5–12.0)
TSH: 5.04 u[IU]/mL — ABNORMAL HIGH (ref 0.450–4.500)

## 2017-01-26 ENCOUNTER — Telehealth: Payer: Self-pay | Admitting: Obstetrics and Gynecology

## 2017-01-26 NOTE — Telephone Encounter (Signed)
Pt aware. appt made for 02/02/17.

## 2017-01-26 NOTE — Telephone Encounter (Signed)
Patient would like call with lab results from Friday  Please call @ cell or work #

## 2017-02-01 DIAGNOSIS — E039 Hypothyroidism, unspecified: Secondary | ICD-10-CM | POA: Insufficient documentation

## 2017-02-02 ENCOUNTER — Encounter: Payer: BLUE CROSS/BLUE SHIELD | Admitting: Obstetrics and Gynecology

## 2017-02-09 ENCOUNTER — Ambulatory Visit
Admission: RE | Admit: 2017-02-09 | Discharge: 2017-02-09 | Disposition: A | Discharge status: Home / Self Care | Payer: BLUE CROSS/BLUE SHIELD | Source: Ambulatory Visit | Attending: Obstetrics and Gynecology | Admitting: Obstetrics and Gynecology

## 2017-02-09 DIAGNOSIS — Z1239 Encounter for other screening for malignant neoplasm of breast: Secondary | ICD-10-CM

## 2017-02-16 DIAGNOSIS — M25561 Pain in right knee: Secondary | ICD-10-CM | POA: Diagnosis not present

## 2017-02-16 DIAGNOSIS — R51 Headache: Secondary | ICD-10-CM | POA: Diagnosis not present

## 2017-02-16 DIAGNOSIS — E039 Hypothyroidism, unspecified: Secondary | ICD-10-CM | POA: Diagnosis not present

## 2017-02-16 DIAGNOSIS — G8929 Other chronic pain: Secondary | ICD-10-CM | POA: Diagnosis not present

## 2017-02-18 ENCOUNTER — Other Ambulatory Visit: Payer: Self-pay

## 2017-02-18 DIAGNOSIS — N63 Unspecified lump in unspecified breast: Secondary | ICD-10-CM

## 2017-02-18 DIAGNOSIS — Z1239 Encounter for other screening for malignant neoplasm of breast: Secondary | ICD-10-CM

## 2017-02-25 ENCOUNTER — Telehealth: Payer: Self-pay | Admitting: Obstetrics and Gynecology

## 2017-02-25 NOTE — Telephone Encounter (Signed)
Patient called stating she needs pre cert to get a diagnostic mammo done. [facility] told the patient that they do not do that. Do you know anything about that? Thanks

## 2017-03-01 NOTE — Telephone Encounter (Signed)
Pt was seen for ae on 01/13/2017. Dr. Algis Downs noted a pea size nodule on left breast at 1oclock. Instead of ordering a screening we ordered a dx. I do not know anything about a pre-cert. Isn't that were you call and verify her benefits? Who is responsible for that? Thanks. CM

## 2017-03-02 NOTE — Telephone Encounter (Signed)
Pt aware. She will contact her insurance.

## 2017-03-02 NOTE — Telephone Encounter (Signed)
I have never once had to do a pre cert for a dx mammogram. I dont know anything about cpts for mammograms. I assumed that [facility] would do that.

## 2017-03-02 NOTE — Telephone Encounter (Signed)
Left message for pt to return call.- per Asher MuirJamie at [facility]. She has never had to do a pre-cert on a mammo before. She gave me the cpt codes and advised me to have pt call her insurance to verify payment. DX 3d mammo- [id number] and [id number]  Left and rt breast u/s- [id number].   Side note- Asher MuirJamie states most dx mammo are applied to pts deductible.

## 2017-03-08 ENCOUNTER — Other Ambulatory Visit: Payer: Self-pay

## 2017-03-08 ENCOUNTER — Telehealth: Payer: Self-pay | Admitting: Obstetrics and Gynecology

## 2017-03-08 DIAGNOSIS — E039 Hypothyroidism, unspecified: Secondary | ICD-10-CM | POA: Diagnosis not present

## 2017-03-08 DIAGNOSIS — Z1239 Encounter for other screening for malignant neoplasm of breast: Secondary | ICD-10-CM

## 2017-03-08 NOTE — Telephone Encounter (Signed)
Pt aware dx was ordered d/t lump at 1 oclock. Pt prefers to have screening and if she needs dx she will get it. Her insurance covers a screening. Screening mammo ordered.

## 2017-03-08 NOTE — Telephone Encounter (Signed)
Patient would like to speak with someone in regards to a mammogram coming up, Patient is concerned and stated that insurance will not cover the mammogram and would like to know if the appointment/procedure can be coded differently as "preventative-care" appointment so that it is covered. Patient did not disclose any other information. Please advise.

## 2017-03-09 DIAGNOSIS — E039 Hypothyroidism, unspecified: Secondary | ICD-10-CM | POA: Diagnosis not present

## 2017-03-11 ENCOUNTER — Ambulatory Visit
Admission: RE | Admit: 2017-03-11 | Discharge: 2017-03-11 | Disposition: A | Discharge status: Home / Self Care | Payer: BLUE CROSS/BLUE SHIELD | Source: Ambulatory Visit | Attending: Obstetrics and Gynecology | Admitting: Obstetrics and Gynecology

## 2017-03-11 DIAGNOSIS — Z1239 Encounter for other screening for malignant neoplasm of breast: Secondary | ICD-10-CM

## 2017-03-11 DIAGNOSIS — N6320 Unspecified lump in the left breast, unspecified quadrant: Secondary | ICD-10-CM | POA: Diagnosis present

## 2017-03-11 DIAGNOSIS — N63 Unspecified lump in unspecified breast: Secondary | ICD-10-CM

## 2017-03-11 DIAGNOSIS — R928 Other abnormal and inconclusive findings on diagnostic imaging of breast: Secondary | ICD-10-CM | POA: Diagnosis not present

## 2017-03-11 DIAGNOSIS — N6489 Other specified disorders of breast: Secondary | ICD-10-CM | POA: Diagnosis not present

## 2017-03-11 DIAGNOSIS — N6321 Unspecified lump in the left breast, upper outer quadrant: Secondary | ICD-10-CM | POA: Diagnosis not present

## 2017-09-29 DIAGNOSIS — E039 Hypothyroidism, unspecified: Secondary | ICD-10-CM | POA: Diagnosis not present

## 2017-09-29 DIAGNOSIS — R300 Dysuria: Secondary | ICD-10-CM | POA: Diagnosis not present

## 2017-09-29 DIAGNOSIS — R51 Headache: Secondary | ICD-10-CM | POA: Diagnosis not present

## 2017-10-06 DIAGNOSIS — R51 Headache: Secondary | ICD-10-CM | POA: Diagnosis not present

## 2017-10-06 DIAGNOSIS — Z0000 Encounter for general adult medical examination without abnormal findings: Secondary | ICD-10-CM | POA: Diagnosis not present

## 2018-01-19 ENCOUNTER — Encounter: Payer: BLUE CROSS/BLUE SHIELD | Admitting: Obstetrics and Gynecology

## 2018-01-31 NOTE — Progress Notes (Signed)
Patient ID: Adrienne Horne, female   DOB: 14-Apr-1961, 57 y.o.   MRN: [id number] ANNUAL PREVENTATIVE CARE GYN  ENCOUNTER NOTE  Subjective:       Adrienne Horne is a 57 y.o. [phone] female here for a routine annual gynecologic exam.  Current complaints: 1.  Rash in groin- prefers nystatin powder  Patient reports normal bowel and bladder function. She does note some ongoing vaginal dryness.  Currently not sexually active.   Gynecologic History No LMP recorded. Patient is postmenopausal. Contraception: vasectomy Last Pap: 01/2017 neg/neg. Results were: normal Last mammogram: 02/2017  birad 2. Results were: normal Colonoscopy 2016-normal  Obstetric History OB History  Gravida Para Term Preterm AB Living  SAB TAB Ectopic Multiple Live Births          2    # Outcome Date GA Lbr Len/2nd Weight Sex Delivery Anes PTL Lv  2 Term    7 lb 8 oz (3.402 kg) F Vag-Spont   LIV     Complications: Endometritis affecting pregnancy  1 Term    5 lb 9.6 oz (2.54 kg) F Vag-Spont   LIV    Past Medical History:  Diagnosis Date  . Chronic left-sided headaches    UNCLEAR ETIOLOGY  . Constipation   . Family history of breast cancer   . Fever blister   . Folliculitis   . GERD (gastroesophageal reflux disease)   . Goiter   . Hashimoto's disease   . Increased BMI   . Menopause   . Plantar fasciitis   . Vaginal atrophy     Past Surgical History:  Procedure Laterality Date  . GYNECOLOGIC CRYOSURGERY    . TONSILLECTOMY      Current Outpatient Medications on File Prior to Visit  Medication Sig Dispense Refill  . fluticasone (FLONASE) 50 MCG/ACT nasal spray Place 2 sprays into the nose daily as needed for rhinitis.     Marland Kitchen HYDROcodone-ibuprofen (VICOPROFEN) 7.5-200 MG per tablet Take 1 tablet by mouth 2 (two) times daily as needed for severe pain.     Marland Kitchen ibuprofen (ADVIL,MOTRIN) 200 MG tablet Take 200 tablets by mouth daily.    Marland Kitchen nystatin ointment (MYCOSTATIN) Apply 1 application  topically 2 (two) times daily. 30 g 0  . nystatin-triamcinolone (MYCOLOG II) cream Apply 1 application topically 2 (two) times daily. 30 g 0  . pantoprazole (PROTONIX) 40 MG tablet Take 1 tablet by mouth daily.    . valACYclovir (VALTREX) 1000 MG tablet Take 2 tablets by mouth 2 (two) times daily as needed (cold sores).      No current facility-administered medications on file prior to visit.     Allergies  Allergen Reactions  . Penicillin G Rash    In high doses.  Ok to take amoxicillin    Social History   Socioeconomic History  . Marital status: Married    Spouse name: Not on file  . Number of children: Not on file  . Years of education: Not on file  . Highest education level: Not on file  Occupational History  . Not on file  Social Needs  . Financial resource strain: Not on file  . Food insecurity:    Worry: Not on file    Inability: Not on file  . Transportation needs:    Medical: Not on file    Non-medical: Not on file  Tobacco Use  . Smoking status: Never Smoker  . Smokeless tobacco: Never Used  Substance and Sexual Activity  . Alcohol use: No  . Drug use: No  . Sexual activity: Yes    Birth control/protection: Other-see comments    Comment: VASECTOMY  Lifestyle  . Physical activity:    Days per week: Not on file    Minutes per session: Not on file  . Stress: Not on file  Relationships  . Social connections:    Talks on phone: Not on file    Gets together: Not on file    Attends religious service: Not on file    Active member of club or organization: Not on file    Attends meetings of clubs or organizations: Not on file    Relationship status: Not on file  . Intimate partner violence:    Fear of current or ex partner: Not on file    Emotionally abused: Not on file    Physically abused: Not on file    Forced sexual activity: Not on file  Other Topics Concern  . Not on file  Social History Narrative  . Not on file    Family History  Problem  Relation Age of Onset  . Breast cancer Mother 24  . Heart disease Mother   . Pancreatic cancer Father   . Hypothyroidism Sister   . Diabetes Neg Hx   . Colon cancer Neg Hx   . Ovarian cancer Neg Hx   Maternal grandmother-melanoma  The following portions of the patient's history were reviewed and updated as appropriate: allergies, current medications, past family history, past medical history, past social history, past surgical history and problem list.  Review of Systems Review of Systems  Constitutional: Negative.   HENT: Negative.   Eyes: Negative.   Respiratory: Negative.   Cardiovascular: Negative.   Gastrointestinal: Negative.   Genitourinary:       Vaginal dryness  Musculoskeletal: Negative.   Skin: Negative.   Neurological: Negative.   Endo/Heme/Allergies: Negative.   Psychiatric/Behavioral: Negative.      Objective:   BP 98/65   Pulse 75   Ht  (1.676 m)   Wt 173 lb 3.2 oz (78.6 kg)   BMI 27.96 kg/m  CONSTITUTIONAL: Well-developed, well-nourished female in no acute distress.  PSYCHIATRIC: Normal mood and affect. Normal behavior. Normal judgment and thought content. NEUROLGIC: Alert and oriented to person, place, and time. Normal muscle tone coordination. No cranial nerve deficit noted. HENT:  Normocephalic, atraumatic, External right and left ear normal. EYES: Conjunctivae and EOM are normal. No scleral icterus.  NECK: Normal range of motion, supple, no masses.  Normal thyroid.  SKIN: Skin is warm and dry. No rash noted. Not diaphoretic. No erythema. No pallor. CARDIOVASCULAR: Normal heart rate noted, regular rhythm, no murmur. RESPIRATORY: Clear to auscultation bilaterally. Effort and breath sounds normal, no problems with respiration noted. BREASTS: Symmetric in size. No masses, skin changes, nipple drainage, or lymphadenopathy.  ABDOMEN: Soft, no distention noted.  No tenderness, rebound or guarding.  BLADDER: Normal PELVIC:  External Genitalia:  Normal  BUS: Normal  Vagina: Moderate atrophy  Cervix: Normal; atrophic appearing with mottled coloration; no cervical motion tenderness  Uterus: Normal; midplane, normal size and shape, mobile, nontender  Adnexa: Normal  RV: External Exam NormaI, No Rectal Masses and Normal Sphincter tone  MUSCULOSKELETAL: Normal range of motion. No tenderness.  No cyanosis, clubbing, or edema.   LYMPHATIC: No Axillary, Supraclavicular, or Inguinal Adenopathy.    Assessment:   Annual gynecologic examination 57 y.o. Contraception: vasectomy BVMI-27 Menopause, asymptomatic Family history of  breast cancer, pancreatic cancer, melanoma Vaginal atrophy,Mildly symptomatic  Plan:  Pap:Due 2021- prefers pap yearly- pap w/rflx Mammogram: Ordered Stool Guaiac Testing: ordered Labs:thru pcp Routine preventative health maintenance measures emphasized: Exercise/Diet/Weight control, Tobacco Warnings and Alcohol/Substance use risks Calcium with vitamin D supplementation recommended; consider Tums Literature is given regarding genetic testing for cancer syndromes Return to Clinic - 1 Year   Adrienne Horne, CMA  Herold Harms, MD  Note: This dictation was prepared with Dragon dictation along with smaller phrase technology. Any transcriptional errors that result from this process are unintentional.

## 2018-02-02 ENCOUNTER — Encounter: Payer: Self-pay | Admitting: Obstetrics and Gynecology

## 2018-02-02 ENCOUNTER — Ambulatory Visit (INDEPENDENT_AMBULATORY_CARE_PROVIDER_SITE_OTHER): Payer: BLUE CROSS/BLUE SHIELD | Admitting: Obstetrics and Gynecology

## 2018-02-02 VITALS — BP 98/65 | HR 75 | Ht 66.0 in | Wt 173.2 lb

## 2018-02-02 DIAGNOSIS — Z01419 Encounter for gynecological examination (general) (routine) without abnormal findings: Secondary | ICD-10-CM | POA: Diagnosis not present

## 2018-02-02 DIAGNOSIS — Z1231 Encounter for screening mammogram for malignant neoplasm of breast: Secondary | ICD-10-CM

## 2018-02-02 DIAGNOSIS — Z1211 Encounter for screening for malignant neoplasm of colon: Secondary | ICD-10-CM

## 2018-02-02 DIAGNOSIS — Z780 Asymptomatic menopausal state: Secondary | ICD-10-CM | POA: Diagnosis not present

## 2018-02-02 DIAGNOSIS — Z803 Family history of malignant neoplasm of breast: Secondary | ICD-10-CM | POA: Diagnosis not present

## 2018-02-02 DIAGNOSIS — Z1239 Encounter for other screening for malignant neoplasm of breast: Secondary | ICD-10-CM

## 2018-02-02 DIAGNOSIS — R638 Other symptoms and signs concerning food and fluid intake: Secondary | ICD-10-CM

## 2018-02-02 DIAGNOSIS — N952 Postmenopausal atrophic vaginitis: Secondary | ICD-10-CM

## 2018-02-02 DIAGNOSIS — Z87410 Personal history of cervical dysplasia: Secondary | ICD-10-CM | POA: Diagnosis not present

## 2018-02-02 MED ORDER — NYSTATIN 100000 UNIT/GM EX POWD: Freq: Two times a day (BID) | CUTANEOUS | 2 refills | Status: DC

## 2018-02-02 NOTE — Patient Instructions (Addendum)
1.  Pap smear is done. 2.  Mammogram is ordered 3.  Stool guaiac card testing is given for colon cancer screening 4.  Screening labs are obtained through primary care 5.  Continue with healthy eating and exercise with controlled weight loss 6.  Recommend calcium 600 mg twice a day and vitamin D 400 international units twice a day. Sources of calcium:  Citracal  Os-Cal  Viactiv chews  Tums  7.  Return in 1 year for physical 8.  Literature was given regarding genetic testing for cancer syndromes   Health Maintenance for Postmenopausal Women Menopause is a normal process in which your reproductive ability comes to an end. This process happens gradually over a span of months to years, usually between the ages of 33 and 4. Menopause is complete when you have missed 12 consecutive menstrual periods. It is important to talk with your health care provider about some of the most common conditions that affect postmenopausal women, such as heart disease, cancer, and bone loss (osteoporosis). Adopting a healthy lifestyle and getting preventive care can help to promote your health and wellness. Those actions can also lower your chances of developing some of these common conditions. What should I know about menopause? During menopause, you may experience a number of symptoms, such as:  Moderate-to-severe hot flashes.  Night sweats.  Decrease in sex drive.  Mood swings.  Headaches.  Tiredness.  Irritability.  Memory problems.  Insomnia.  Choosing to treat or not to treat menopausal changes is an individual decision that you make with your health care provider. What should I know about hormone replacement therapy and supplements? Hormone therapy products are effective for treating symptoms that are associated with menopause, such as hot flashes and night sweats. Hormone replacement carries certain risks, especially as you become older. If you are thinking about using estrogen or estrogen  with progestin treatments, discuss the benefits and risks with your health care provider. What should I know about heart disease and stroke? Heart disease, heart attack, and stroke become more likely as you age. This may be due, in part, to the hormonal changes that your body experiences during menopause. These can affect how your body processes dietary fats, triglycerides, and cholesterol. Heart attack and stroke are both medical emergencies. There are many things that you can do to help prevent heart disease and stroke:  Have your blood pressure checked at least every 1-2 years. High blood pressure causes heart disease and increases the risk of stroke.  If you are 55-55 years old, ask your health care provider if you should take aspirin to prevent a heart attack or a stroke.  Do not use any tobacco products, including cigarettes, chewing tobacco, or electronic cigarettes. If you need help quitting, ask your health care provider.  It is important to eat a healthy diet and maintain a healthy weight. ? Be sure to include plenty of vegetables, fruits, low-fat dairy products, and lean protein. ? Avoid eating foods that are high in solid fats, added sugars, or salt (sodium).  Get regular exercise. This is one of the most important things that you can do for your health. ? Try to exercise for at least 150 minutes each week. The type of exercise that you do should increase your heart rate and make you sweat. This is known as moderate-intensity exercise. ? Try to do strengthening exercises at least twice each week. Do these in addition to the moderate-intensity exercise.  Know your numbers.Ask your health care provider  to check your cholesterol and your blood glucose. Continue to have your blood tested as directed by your health care provider.  What should I know about cancer screening? There are several types of cancer. Take the following steps to reduce your risk and to catch any cancer development  as early as possible. Breast Cancer  Practice breast self-awareness. ? This means understanding how your breasts normally appear and feel. ? It also means doing regular breast self-exams. Let your health care provider know about any changes, no matter how small.  If you are 55 or older, have a clinician do a breast exam (clinical breast exam or CBE) every year. Depending on your age, family history, and medical history, it may be recommended that you also have a yearly breast X-ray (mammogram).  If you have a family history of breast cancer, talk with your health care provider about genetic screening.  If you are at high risk for breast cancer, talk with your health care provider about having an MRI and a mammogram every year.  Breast cancer (BRCA) gene test is recommended for women who have family members with BRCA-related cancers. Results of the assessment will determine the need for genetic counseling and BRCA1 and for BRCA2 testing. BRCA-related cancers include these types: ? Breast. This occurs in males or females. ? Ovarian. ? Tubal. This may also be called fallopian tube cancer. ? Cancer of the abdominal or pelvic lining (peritoneal cancer). ? Prostate. ? Pancreatic.  Cervical, Uterine, and Ovarian Cancer Your health care provider may recommend that you be screened regularly for cancer of the pelvic organs. These include your ovaries, uterus, and vagina. This screening involves a pelvic exam, which includes checking for microscopic changes to the surface of your cervix (Pap test).  For women ages 21-65, health care providers may recommend a pelvic exam and a Pap test every three years. For women ages 25-65, they may recommend the Pap test and pelvic exam, combined with testing for human papilloma virus (HPV), every five years. Some types of HPV increase your risk of cervical cancer. Testing for HPV may also be done on women of any age who have unclear Pap test results.  Other health  care providers may not recommend any screening for nonpregnant women who are considered low risk for pelvic cancer and have no symptoms. Ask your health care provider if a screening pelvic exam is right for you.  If you have had past treatment for cervical cancer or a condition that could lead to cancer, you need Pap tests and screening for cancer for at least 20 years after your treatment. If Pap tests have been discontinued for you, your risk factors (such as having a new sexual partner) need to be reassessed to determine if you should start having screenings again. Some women have medical problems that increase the chance of getting cervical cancer. In these cases, your health care provider may recommend that you have screening and Pap tests more often.  If you have a family history of uterine cancer or ovarian cancer, talk with your health care provider about genetic screening.  If you have vaginal bleeding after reaching menopause, tell your health care provider.  There are currently no reliable tests available to screen for ovarian cancer.  Lung Cancer Lung cancer screening is recommended for adults 30-101 years old who are at high risk for lung cancer because of a history of smoking. A yearly low-dose CT scan of the lungs is recommended if you:  Currently  smoke.  Have a history of at least 30 pack-years of smoking and you currently smoke or have quit within the past 15 years. A pack-year is smoking an average of one pack of cigarettes per day for one year.  Yearly screening should:  Continue until it has been 15 years since you quit.  Stop if you develop a health problem that would prevent you from having lung cancer treatment.  Colorectal Cancer  This type of cancer can be detected and can often be prevented.  Routine colorectal cancer screening usually begins at age 22 and continues through age 36.  If you have risk factors for colon cancer, your health care provider may  recommend that you be screened at an earlier age.  If you have a family history of colorectal cancer, talk with your health care provider about genetic screening.  Your health care provider may also recommend using home test kits to check for hidden blood in your stool.  A small camera at the end of a tube can be used to examine your colon directly (sigmoidoscopy or colonoscopy). This is done to check for the earliest forms of colorectal cancer.  Direct examination of the colon should be repeated every 5-10 years until age 78. However, if early forms of precancerous polyps or small growths are found or if you have a family history or genetic risk for colorectal cancer, you may need to be screened more often.  Skin Cancer  Check your skin from head to toe regularly.  Monitor any moles. Be sure to tell your health care provider: ? About any new moles or changes in moles, especially if there is a change in a mole's shape or color. ? If you have a mole that is larger than the size of a pencil eraser.  If any of your family members has a history of skin cancer, especially at a young age, talk with your health care provider about genetic screening.  Always use sunscreen. Apply sunscreen liberally and repeatedly throughout the day.  Whenever you are outside, protect yourself by wearing long sleeves, pants, a wide-brimmed hat, and sunglasses.  What should I know about osteoporosis? Osteoporosis is a condition in which bone destruction happens more quickly than new bone creation. After menopause, you may be at an increased risk for osteoporosis. To help prevent osteoporosis or the bone fractures that can happen because of osteoporosis, the following is recommended:  If you are 42-50 years old, get at least 1,000 mg of calcium and at least 600 mg of vitamin D per day.  If you are older than age 76 but younger than age 41, get at least 1,200 mg of calcium and at least 600 mg of vitamin D per  day.  If you are older than age 38, get at least 1,200 mg of calcium and at least 800 mg of vitamin D per day.  Smoking and excessive alcohol intake increase the risk of osteoporosis. Eat foods that are rich in calcium and vitamin D, and do weight-bearing exercises several times each week as directed by your health care provider. What should I know about how menopause affects my mental health? Depression may occur at any age, but it is more common as you become older. Common symptoms of depression include:  Low or sad mood.  Changes in sleep patterns.  Changes in appetite or eating patterns.  Feeling an overall lack of motivation or enjoyment of activities that you previously enjoyed.  Frequent crying spells.  Talk  with your health care provider if you think that you are experiencing depression. What should I know about immunizations? It is important that you get and maintain your immunizations. These include:  Tetanus, diphtheria, and pertussis (Tdap) booster vaccine.  Influenza every year before the flu season begins.  Pneumonia vaccine.  Shingles vaccine.  Your health care provider may also recommend other immunizations. This information is not intended to replace advice given to you by your health care provider. Make sure you discuss any questions you have with your health care provider. Document Released: 10/23/2005 Document Revised: 03/20/2016 Document Reviewed: 06/04/2015 Elsevier Interactive Patient Education  2018 [employer].

## 2018-02-04 LAB — PAP IG W/ RFLX HPV ASCU: PAP Smear Comment: 0

## 2018-03-21 ENCOUNTER — Ambulatory Visit
Admission: RE | Admit: 2018-03-21 | Discharge: 2018-03-21 | Disposition: A | Discharge status: Home / Self Care | Payer: BLUE CROSS/BLUE SHIELD | Source: Ambulatory Visit | Attending: Obstetrics and Gynecology | Admitting: Obstetrics and Gynecology

## 2018-03-21 DIAGNOSIS — Z1231 Encounter for screening mammogram for malignant neoplasm of breast: Secondary | ICD-10-CM | POA: Insufficient documentation

## 2018-03-21 DIAGNOSIS — Z803 Family history of malignant neoplasm of breast: Secondary | ICD-10-CM | POA: Diagnosis not present

## 2018-03-21 DIAGNOSIS — Z1239 Encounter for other screening for malignant neoplasm of breast: Secondary | ICD-10-CM

## 2018-10-25 DIAGNOSIS — Z0000 Encounter for general adult medical examination without abnormal findings: Secondary | ICD-10-CM | POA: Diagnosis not present

## 2018-10-25 DIAGNOSIS — E010 Iodine-deficiency related diffuse (endemic) goiter: Secondary | ICD-10-CM | POA: Diagnosis not present

## 2018-10-25 DIAGNOSIS — R5383 Other fatigue: Secondary | ICD-10-CM | POA: Diagnosis not present

## 2018-10-25 DIAGNOSIS — M25561 Pain in right knee: Secondary | ICD-10-CM | POA: Diagnosis not present

## 2018-11-02 DIAGNOSIS — E039 Hypothyroidism, unspecified: Secondary | ICD-10-CM | POA: Diagnosis not present

## 2018-11-02 DIAGNOSIS — Z0000 Encounter for general adult medical examination without abnormal findings: Secondary | ICD-10-CM | POA: Diagnosis not present

## 2018-11-02 DIAGNOSIS — Z1389 Encounter for screening for other disorder: Secondary | ICD-10-CM | POA: Diagnosis not present

## 2018-11-02 DIAGNOSIS — R5383 Other fatigue: Secondary | ICD-10-CM | POA: Diagnosis not present

## 2019-01-13 DIAGNOSIS — E010 Iodine-deficiency related diffuse (endemic) goiter: Secondary | ICD-10-CM | POA: Diagnosis not present

## 2019-01-23 ENCOUNTER — Other Ambulatory Visit: Payer: Self-pay | Admitting: Obstetrics and Gynecology

## 2019-01-23 DIAGNOSIS — Z1231 Encounter for screening mammogram for malignant neoplasm of breast: Secondary | ICD-10-CM

## 2019-01-31 ENCOUNTER — Other Ambulatory Visit: Payer: Self-pay | Admitting: Internal Medicine

## 2019-01-31 DIAGNOSIS — Z1231 Encounter for screening mammogram for malignant neoplasm of breast: Secondary | ICD-10-CM

## 2019-02-08 ENCOUNTER — Encounter: Payer: BLUE CROSS/BLUE SHIELD | Admitting: Obstetrics and Gynecology

## 2019-03-23 ENCOUNTER — Ambulatory Visit
Admission: RE | Admit: 2019-03-23 | Discharge: 2019-03-23 | Disposition: A | Discharge status: Home / Self Care | Payer: 59 | Source: Ambulatory Visit | Attending: Internal Medicine | Admitting: Internal Medicine

## 2019-03-23 DIAGNOSIS — Z1231 Encounter for screening mammogram for malignant neoplasm of breast: Secondary | ICD-10-CM | POA: Insufficient documentation

## 2019-04-21 ENCOUNTER — Encounter: Payer: BLUE CROSS/BLUE SHIELD | Admitting: Obstetrics and Gynecology

## 2019-06-06 ENCOUNTER — Ambulatory Visit (INDEPENDENT_AMBULATORY_CARE_PROVIDER_SITE_OTHER): Payer: 59 | Admitting: Obstetrics and Gynecology

## 2019-06-06 ENCOUNTER — Encounter: Payer: Self-pay | Admitting: Obstetrics and Gynecology

## 2019-06-06 ENCOUNTER — Other Ambulatory Visit: Payer: Self-pay

## 2019-06-06 ENCOUNTER — Other Ambulatory Visit (HOSPITAL_COMMUNITY)
Admission: RE | Admit: 2019-06-06 | Discharge: 2019-06-06 | Disposition: A | Discharge status: Home / Self Care | Payer: 59 | Source: Ambulatory Visit | Attending: Obstetrics and Gynecology | Admitting: Obstetrics and Gynecology

## 2019-06-06 VITALS — BP 120/81 | HR 75 | Ht 63.0 in | Wt 169.6 lb

## 2019-06-06 DIAGNOSIS — Z01419 Encounter for gynecological examination (general) (routine) without abnormal findings: Secondary | ICD-10-CM | POA: Insufficient documentation

## 2019-06-06 DIAGNOSIS — Z803 Family history of malignant neoplasm of breast: Secondary | ICD-10-CM | POA: Diagnosis not present

## 2019-06-06 DIAGNOSIS — Z780 Asymptomatic menopausal state: Secondary | ICD-10-CM | POA: Diagnosis not present

## 2019-06-06 DIAGNOSIS — N952 Postmenopausal atrophic vaginitis: Secondary | ICD-10-CM

## 2019-06-06 DIAGNOSIS — Z1239 Encounter for other screening for malignant neoplasm of breast: Secondary | ICD-10-CM | POA: Diagnosis not present

## 2019-06-06 DIAGNOSIS — E669 Obesity, unspecified: Secondary | ICD-10-CM

## 2019-06-06 NOTE — Patient Instructions (Signed)
Preventive Care 40-56 Years Old, Female Preventive care refers to visits with your health care provider and lifestyle choices that can promote health and wellness. This includes:  A yearly physical exam. This may also be called an annual well check.  Regular dental visits and eye exams.  Immunizations.  Screening for certain conditions.  Healthy lifestyle choices, such as eating a healthy diet, getting regular exercise, not using drugs or products that contain nicotine and tobacco, and limiting alcohol use. What can I expect for my preventive care visit? Physical exam Your health care provider will check your:  Height and weight. This may be used to calculate body mass index (BMI), which tells if you are at a healthy weight.  Heart rate and blood pressure.  Skin for abnormal spots. Counseling Your health care provider may ask you questions about your:  Alcohol, tobacco, and drug use.  Emotional well-being.  Home and relationship well-being.  Sexual activity.  Eating habits.  Work and work environment.  Method of birth control.  Menstrual cycle.  Pregnancy history. What immunizations do I need?  Influenza (flu) vaccine  This is recommended every year. Tetanus, diphtheria, and pertussis (Tdap) vaccine  You may need a Td booster every 10 years. Varicella (chickenpox) vaccine  You may need this if you have not been vaccinated. Zoster (shingles) vaccine  You may need this after age 60. Measles, mumps, and rubella (MMR) vaccine  You may need at least one dose of MMR if you were born in 1957 or later. You may also need a second dose. Pneumococcal conjugate (PCV13) vaccine  You may need this if you have certain conditions and were not previously vaccinated. Pneumococcal polysaccharide (PPSV23) vaccine  You may need one or two doses if you smoke cigarettes or if you have certain conditions. Meningococcal conjugate (MenACWY) vaccine  You may need this if you  have certain conditions. Hepatitis A vaccine  You may need this if you have certain conditions or if you travel or work in places where you may be exposed to hepatitis A. Hepatitis B vaccine  You may need this if you have certain conditions or if you travel or work in places where you may be exposed to hepatitis B. Haemophilus influenzae type b (Hib) vaccine  You may need this if you have certain conditions. Human papillomavirus (HPV) vaccine  If recommended by your health care provider, you may need three doses over 6 months. You may receive vaccines as individual doses or as more than one vaccine together in one shot (combination vaccines). Talk with your health care provider about the risks and benefits of combination vaccines. What tests do I need? Blood tests  Lipid and cholesterol levels. These may be checked every 5 years, or more frequently if you are over 50 years old.  Hepatitis C test.  Hepatitis B test. Screening  Lung cancer screening. You may have this screening every year starting at age 55 if you have a 30-pack-year history of smoking and currently smoke or have quit within the past 15 years.  Colorectal cancer screening. All adults should have this screening starting at age 50 and continuing until age 75. Your health care provider may recommend screening at age 45 if you are at increased risk. You will have tests every 1-10 years, depending on your results and the type of screening test.  Diabetes screening. This is done by checking your blood sugar (glucose) after you have not eaten for a while (fasting). You may have this   done every 1-3 years.  Mammogram. This may be done every 1-2 years. Talk with your health care provider about when you should start having regular mammograms. This may depend on whether you have a family history of breast cancer.  BRCA-related cancer screening. This may be done if you have a family history of breast, ovarian, tubal, or peritoneal  cancers.  Pelvic exam and Pap test. This may be done every 3 years starting at age 23. Starting at age 86, this may be done every 5 years if you have a Pap test in combination with an HPV test. Other tests  Sexually transmitted disease (STD) testing.  Bone density scan. This is done to screen for osteoporosis. You may have this scan if you are at high risk for osteoporosis. Follow these instructions at home: Eating and drinking  Eat a diet that includes fresh fruits and vegetables, whole grains, lean protein, and low-fat dairy.  Take vitamin and mineral supplements as recommended by your health care provider.  Do not drink alcohol if: ? Your health care provider tells you not to drink. ? You are pregnant, may be pregnant, or are planning to become pregnant.  If you drink alcohol: ? Limit how much you have to 0-1 drink a day. ? Be aware of how much alcohol is in your drink. In the [place] one drink equals one 12 oz bottle of beer (355 mL), one 5 oz glass of wine (148 mL), or one 1 oz glass of hard liquor (44 mL). Lifestyle  Take daily care of your teeth and gums.  Stay active. Exercise for at least 30 minutes on 5 or more days each week.  Do not use any products that contain nicotine or tobacco, such as cigarettes, e-cigarettes, and chewing tobacco. If you need help quitting, ask your health care provider.  If you are sexually active, practice safe sex. Use a condom or other form of birth control (contraception) in order to prevent pregnancy and STIs (sexually transmitted infections).  If told by your health care provider, take low-dose aspirin daily starting at age 27. What's next?  Visit your health care provider once a year for a well check visit.  Ask your health care provider how often you should have your eyes and teeth checked.  Stay up to date on all vaccines. This information is not intended to replace advice given to you by your health care provider. Make sure you  discuss any questions you have with your health care provider. Document Released: 09/27/2015 Document Revised: 05/12/2018 Document Reviewed: 05/12/2018 Elsevier Patient Education  2020 [place] self-awareness is knowing how your breasts look and feel. Doing breast self-awareness is important. It allows you to catch a breast problem early while it is still small and can be treated. All women should do breast self-awareness, including women who have had breast implants. Tell your doctor if you notice a change in your breasts. What you need:  A mirror.  A well-lit room. How to do a breast self-exam A breast self-exam is one way to learn what is normal for your breasts and to check for changes. To do a breast self-exam: Look for changes  1. Take off all the clothes above your waist. 2. Stand in front of a mirror in a room with good lighting. 3. Put your hands on your hips. 4. Push your hands down. 5. Look at your breasts and nipples in the mirror to see if one breast or nipple looks  different from the other. Check to see if: ? The shape of one breast is different. ? The size of one breast is different. ? There are wrinkles, dips, and bumps in one breast and not the other. 6. Look at each breast for changes in the skin, such as: ? Redness. ? Scaly areas. 7. Look for changes in your nipples, such as: ? Liquid around the nipples. ? Bleeding. ? Dimpling. ? Redness. ? A change in where the nipples are. Feel for changes  1. Lie on your back on the floor. 2. Feel each breast. To do this, follow these steps: ? Pick a breast to feel. ? Put the arm closest to that breast above your head. ? Use your other arm to feel the nipple area of your breast. Feel the area with the pads of your three middle fingers by making small circles with your fingers. For the first circle, press lightly. For the second circle, press harder. For the third circle, press even harder.  ? Keep making circles with your fingers at the different pressures as you move down your breast. Stop when you feel your ribs. ? Move your fingers a little toward the center of your body. ? Start making circles with your fingers again, this time going up until you reach your collarbone. ? Keep making up-and-down circles until you reach your armpit. Remember to keep using the three pressures. ? Feel the other breast in the same way. 3. Sit or stand in the tub or shower. 4. With soapy water on your skin, feel each breast the same way you did in step 2 when you were lying on the floor. Write down what you find Writing down what you find can help you remember what to tell your doctor. Write down:  What is normal for each breast.  Any changes you find in each breast, including: ? The kind of changes you find. ? Whether you have pain. ? Size and location of any lumps.  When you last had your menstrual period. General tips  Check your breasts every month.  If you are breastfeeding, the best time to check your breasts is after you feed your baby or after you use a breast pump.  If you get menstrual periods, the best time to check your breasts is 5-7 days after your menstrual period is over.  With time, you will become comfortable with the self-exam, and you will begin to know if there are changes in your breasts. Contact a doctor if you:  See a change in the shape or size of your breasts or nipples.  See a change in the skin of your breast or nipples, such as red or scaly skin.  Have fluid coming from your nipples that is not normal.  Find a lump or thick area that was not there before.  Have pain in your breasts.  Have any concerns about your breast health. Summary  Breast self-awareness includes looking for changes in your breasts, as well as feeling for changes within your breasts.  Breast self-awareness should be done in front of a mirror in a well-lit room.  You should  check your breasts every month. If you get menstrual periods, the best time to check your breasts is 5-7 days after your menstrual period is over.  Let your doctor know of any changes you see in your breasts, including changes in size, changes on the skin, pain or tenderness, or fluid from your nipples that is not normal. This  information is not intended to replace advice given to you by your health care provider. Make sure you discuss any questions you have with your health care provider. Document Released: 02/17/2008 Document Revised: 04/19/2018 Document Reviewed: 04/19/2018 Elsevier Patient Education  2020 [employer].

## 2019-06-06 NOTE — Progress Notes (Signed)
Pt is present today for annual exam. Pt stated having pressure when she urinate.

## 2019-06-06 NOTE — Progress Notes (Signed)
ANNUAL PREVENTATIVE CARE GYNECOLOGY  ENCOUNTER NOTE  Subjective:       Adrienne Horne is a 58 y.o. [phone] female here for a routine annual gynecologic exam. The patient is sexually active. The patient is not taking hormone replacement therapy. Patient denies post-menopausal vaginal bleeding. The patient wears seatbelts: yes. The patient participates in regular exercise: yes. Has the patient ever been transfused or tattooed?: no. The patient reports that there is not domestic violence in her life.  Current complaints: 1. Patient notes that she worries about developing cancer. She reports that her father has a h/o pancreatic cancer, mom had a h/o breast cancer 20 yrs ago, with recurrence (mets to liver), who passed in May. Wonders if there is anything more that she needs to be doing at this time.    Gynecologic History No LMP recorded. Patient is postmenopausal. Contraception: post menopausal status and vasectomy Last Pap: 01/2017. Results were: normal Last mammogram: 03/23/2019. Results were: normal Last Colonoscopy: 04/2015. Results were: normal.    Obstetric History OB History  Gravida Para Term Preterm AB Living  2 2 2     2   SAB TAB Ectopic Multiple Live Births          2    # Outcome Date GA Lbr Len/2nd Weight Sex Delivery Anes PTL Lv  2 Term    7 lb 8 oz (3.402 kg) F Vag-Spont   LIV     Complications: Endometritis affecting pregnancy  1 Term    5 lb 9.6 oz (2.54 kg) F Vag-Spont   LIV    Past Medical History:  Diagnosis Date  . Arthritis    arthritis in knees  . Chronic left-sided headaches    UNCLEAR ETIOLOGY  . Constipation   . Family history of breast cancer   . Fever blister   . Folliculitis   . GERD (gastroesophageal reflux disease)   . Goiter   . Hashimoto's disease   . Increased BMI   . Menopause   . Plantar fasciitis   . Vaginal atrophy     Family History  Problem Relation Age of Onset  . Breast cancer Mother 37  . Heart disease Mother   .  Pancreatic cancer Father   . Hypothyroidism Sister   . Diabetes Neg Hx   . Colon cancer Neg Hx   . Ovarian cancer Neg Hx     Past Surgical History:  Procedure Laterality Date  . GYNECOLOGIC CRYOSURGERY    . TONSILLECTOMY      Social History   Socioeconomic History  . Marital status: Married    Spouse name: Not on file  . Number of children: Not on file  . Years of education: Not on file  . Highest education level: Not on file  Occupational History  . Not on file  Social Needs  . Financial resource strain: Not on file  . Food insecurity    Worry: Not on file    Inability: Not on file  . Transportation needs    Medical: Not on file    Non-medical: Not on file  Tobacco Use  . Smoking status: Never Smoker  . Smokeless tobacco: Never Used  Substance and Sexual Activity  . Alcohol use: No  . Drug use: No  . Sexual activity: Yes    Birth control/protection: Other-see comments    Comment: VASECTOMY  Lifestyle  . Physical activity    Days per week: 0 days    Minutes per session: 0 min  .  Stress: Not on file  Relationships  . Social Musician on phone: Not on file    Gets together: Not on file    Attends religious service: Not on file    Active member of club or organization: Not on file    Attends meetings of clubs or organizations: Not on file    Relationship status: Not on file  . Intimate partner violence    Fear of current or ex partner: Not on file    Emotionally abused: Not on file    Physically abused: Not on file    Forced sexual activity: Not on file  Other Topics Concern  . Not on file  Social History Narrative  . Not on file    Current Outpatient Medications on File Prior to Visit  Medication Sig Dispense Refill  . fluticasone (FLONASE) 50 MCG/ACT nasal spray Place 2 sprays into the nose daily as needed for rhinitis.     Marland Kitchen HYDROcodone-ibuprofen (VICOPROFEN) 7.5-200 MG per tablet Take 1 tablet by mouth 2 (two) times daily as needed for  severe pain.     Marland Kitchen ibuprofen (ADVIL,MOTRIN) 200 MG tablet Take 200 tablets by mouth daily.    . pantoprazole (PROTONIX) 40 MG tablet Take 1 tablet by mouth daily.    . valACYclovir (VALTREX) 1000 MG tablet Take 2 tablets by mouth 2 (two) times daily as needed (cold sores).     . nystatin (NYSTATIN) powder Apply topically 2 (two) times daily. (Patient not taking: Reported on 06/06/2019) 15 g 2  . nystatin ointment (MYCOSTATIN) Apply 1 application topically 2 (two) times daily. (Patient not taking: Reported on 06/06/2019) 30 g 0  . nystatin-triamcinolone (MYCOLOG II) cream Apply 1 application topically 2 (two) times daily. (Patient not taking: Reported on 06/06/2019) 30 g 0   No current facility-administered medications on file prior to visit.     Allergies  Allergen Reactions  . Penicillin G Rash    In high doses.  Ok to take amoxicillin      Review of Systems ROS Review of Systems - General ROS: negative for - chills, fatigue, fever, hot flashes, night sweats, weight gain or weight loss Psychological ROS: negative for - anxiety, decreased libido, depression, mood swings, physical abuse or sexual abuse Ophthalmic ROS: negative for - blurry vision, eye pain or loss of vision ENT ROS: negative for - headaches, hearing change, visual changes or vocal changes Allergy and Immunology ROS: negative for - hives, itchy/watery eyes or seasonal allergies Hematological and Lymphatic ROS: negative for - bleeding problems, bruising, swollen lymph nodes or weight loss Endocrine ROS: negative for - galactorrhea, hair pattern changes, hot flashes, malaise/lethargy, mood swings, palpitations, polydipsia/polyuria, skin changes, temperature intolerance or unexpected weight changes Breast ROS: negative for - new or changing breast lumps or nipple discharge Respiratory ROS: negative for - cough or shortness of breath Cardiovascular ROS: negative for - chest pain, irregular heartbeat, palpitations or shortness of  breath Gastrointestinal ROS: no abdominal pain, change in bowel habits, or black or bloody stools Genito-Urinary ROS: no dysuria, trouble voiding, or hematuria Musculoskeletal ROS: negative for - joint pain or joint stiffness Neurological ROS: negative for - bowel and bladder control changes Dermatological ROS: negative for rash and skin lesion changes   Objective:   BP 120/81   Pulse 75   Ht 5\' 3"  (1.6 m)   Wt 169 lb 9.6 oz (76.9 kg)   BMI 30.04 kg/m  CONSTITUTIONAL: Well-developed, well-nourished female in no acute distress.  Mild obesity PSYCHIATRIC: Normal mood and affect. Normal behavior. Normal judgment and thought content. [place]: Alert and oriented to person, place, and time. Normal muscle tone coordination. No cranial nerve deficit noted. HENT:  Normocephalic, atraumatic, External right and left ear normal. Oropharynx is clear and moist EYES: Conjunctivae and EOM are normal. Pupils are equal, round, and reactive to light. No scleral icterus.  NECK: Normal range of motion, supple, no masses.  Normal thyroid.  SKIN: Skin is warm and dry. No rash noted. Not diaphoretic. No erythema. No pallor. CARDIOVASCULAR: Normal heart rate noted, regular rhythm, no murmur. RESPIRATORY: Clear to auscultation bilaterally. Effort and breath sounds normal, no problems with respiration noted. BREASTS: Symmetric in size. No masses, skin changes, nipple drainage, or lymphadenopathy. ABDOMEN: Soft, normal bowel sounds, no distention noted.  No tenderness, rebound or guarding.  BLADDER: Normal PELVIC:  Bladder no bladder distension noted  Urethra: normal appearing urethra with no masses, tenderness or lesions  Vulva: normal appearing vulva with no masses, tenderness or lesions  Vagina: mild vaginal atrophy, no lesions or discharge  Cervix: normal appearing cervix without discharge or lesions  Uterus: uterus is normal size, shape, consistency and nontender  Adnexa: normal adnexa in size, nontender  and no masses  RV: External Exam NormaI, No Rectal Masses and Normal Sphincter tone  MUSCULOSKELETAL: Normal range of motion. No tenderness.  No cyanosis, clubbing, or edema.  2+ distal pulses. LYMPHATIC: No Axillary, Supraclavicular, or Inguinal Adenopathy.   Labs: Labs reviewed in Care Everywhere, performed 11/02/2018.   Assessment:   Encounter for well woman exam with routine gynecological exam  Family history of breast cancer in mother Screening for breast cancer Menopause Vaginal atrophy Obesity (BMI 30.0-34.9)  Plan:  Pap: Pap Co Test. Desires pap yearly despite screening recommendations of q 3-5 years and cost (if not covered by insurance).  Mammogram: Ordered Stool Guaiac Testing:  Not Indicated. Colonoscopy up to date.  Labs: No labs done. Previously performed 11/02/2018 (reviewed in [place]) Routine preventative health maintenance measures emphasized: Exercise/Diet/Weight control, Tobacco Warnings, Alcohol/Substance use risks, Stress Management, Peer Pressure Issues and Safe Sex Mild vaginal atrophy, overall asymptomatic.  Family history of cancer, discussed risk factors, option of genetic cancer screening if she desires to know personal risk.  Patient unsure if she desires testing. Continue to encourage encouraged 3D mammography. Monitor family for any other occurrences.  Will get flu vaccine in several weeks with PCP.  Return to [place], MD [facility]

## 2019-06-09 LAB — CYTOLOGY - PAP
Adequacy: ABSENT
Diagnosis: NEGATIVE
High risk HPV: NEGATIVE
Molecular Disclaimer: 56
Molecular Disclaimer: NORMAL

## 2020-02-05 ENCOUNTER — Other Ambulatory Visit: Payer: Self-pay | Admitting: Internal Medicine

## 2020-02-05 DIAGNOSIS — Z1231 Encounter for screening mammogram for malignant neoplasm of breast: Secondary | ICD-10-CM

## 2020-03-25 ENCOUNTER — Ambulatory Visit
Admission: RE | Admit: 2020-03-25 | Discharge: 2020-03-25 | Disposition: A | Discharge status: Home / Self Care | Payer: BC Managed Care – PPO | Source: Ambulatory Visit | Attending: Internal Medicine | Admitting: Internal Medicine

## 2020-03-25 DIAGNOSIS — Z1231 Encounter for screening mammogram for malignant neoplasm of breast: Secondary | ICD-10-CM | POA: Diagnosis present

## 2020-06-11 ENCOUNTER — Encounter: Payer: 59 | Admitting: Obstetrics and Gynecology

## 2020-08-13 ENCOUNTER — Encounter: Payer: Self-pay | Admitting: Obstetrics and Gynecology

## 2020-08-13 ENCOUNTER — Ambulatory Visit (INDEPENDENT_AMBULATORY_CARE_PROVIDER_SITE_OTHER): Payer: BC Managed Care – PPO | Admitting: Obstetrics and Gynecology

## 2020-08-13 ENCOUNTER — Other Ambulatory Visit: Payer: Self-pay

## 2020-08-13 VITALS — BP 106/69 | HR 82 | Ht 66.0 in | Wt 169.3 lb

## 2020-08-13 DIAGNOSIS — Z803 Family history of malignant neoplasm of breast: Secondary | ICD-10-CM | POA: Diagnosis not present

## 2020-08-13 DIAGNOSIS — Z780 Asymptomatic menopausal state: Secondary | ICD-10-CM

## 2020-08-13 DIAGNOSIS — Z01419 Encounter for gynecological examination (general) (routine) without abnormal findings: Secondary | ICD-10-CM

## 2020-08-13 DIAGNOSIS — E038 Other specified hypothyroidism: Secondary | ICD-10-CM

## 2020-08-13 DIAGNOSIS — R399 Unspecified symptoms and signs involving the genitourinary system: Secondary | ICD-10-CM | POA: Diagnosis not present

## 2020-08-13 DIAGNOSIS — N952 Postmenopausal atrophic vaginitis: Secondary | ICD-10-CM

## 2020-08-13 DIAGNOSIS — E669 Obesity, unspecified: Secondary | ICD-10-CM

## 2020-08-13 LAB — POCT URINALYSIS DIPSTICK OB
Blood, UA: NEGATIVE
Glucose, UA: NEGATIVE
Ketones, UA: NEGATIVE
Nitrite, UA: NEGATIVE
POC,PROTEIN,UA: NEGATIVE
Spec Grav, UA: >=1.03 — AB (ref 1.010–1.025)
Urobilinogen, UA: 0.2 E.U./dL
pH, UA: 6 (ref 5.0–8.0)

## 2020-08-13 NOTE — Progress Notes (Signed)
Pt present for annual exam. Pt stated that she was having vaginal dryness and burning with urination. UA completed and documented.

## 2020-08-13 NOTE — Patient Instructions (Addendum)
Health Maintenance for Postmenopausal Women Menopause is a normal process in which your ability to get pregnant comes to an end. This process happens slowly over many months or years, usually between the ages of 48 and 55. Menopause is complete when you have missed your menstrual periods for 12 months. It is important to talk with your health care provider about some of the most common conditions that affect women after menopause (postmenopausal women). These include heart disease, cancer, and bone loss (osteoporosis). Adopting a healthy lifestyle and getting preventive care can help to promote your health and wellness. The actions you take can also lower your chances of developing some of these common conditions. What should I know about menopause? During menopause, you may get a number of symptoms, such as:  Hot flashes. These can be moderate or severe.  Night sweats.  Decrease in sex drive.  Mood swings.  Headaches.  Tiredness.  Irritability.  Memory problems.  Insomnia. Choosing to treat or not to treat these symptoms is a decision that you make with your health care provider. Do I need hormone replacement therapy?  Hormone replacement therapy is effective in treating symptoms that are caused by menopause, such as hot flashes and night sweats.  Hormone replacement carries certain risks, especially as you become older. If you are thinking about using estrogen or estrogen with progestin, discuss the benefits and risks with your health care provider. What is my risk for heart disease and stroke? The risk of heart disease, heart attack, and stroke increases as you age. One of the causes may be a change in the body's hormones during menopause. This can affect how your body uses dietary fats, triglycerides, and cholesterol. Heart attack and stroke are medical emergencies. There are many things that you can do to help prevent heart disease and stroke. Watch your blood pressure  High  blood pressure causes heart disease and increases the risk of stroke. This is more likely to develop in people who have high blood pressure readings, are of African descent, or are overweight.  Have your blood pressure checked: ? Every 3-5 years if you are 18-39 years of age. ? Every year if you are 40 years old or older. Eat a healthy diet   Eat a diet that includes plenty of vegetables, fruits, low-fat dairy products, and lean protein.  Do not eat a lot of foods that are high in solid fats, added sugars, or sodium. Get regular exercise Get regular exercise. This is one of the most important things you can do for your health. Most adults should:  Try to exercise for at least 150 minutes each week. The exercise should increase your heart rate and make you sweat (moderate-intensity exercise).  Try to do strengthening exercises at least twice each week. Do these in addition to the moderate-intensity exercise.  Spend less time sitting. Even light physical activity can be beneficial. Other tips  Work with your health care provider to achieve or maintain a healthy weight.  Do not use any products that contain nicotine or tobacco, such as cigarettes, e-cigarettes, and chewing tobacco. If you need help quitting, ask your health care provider.  Know your numbers. Ask your health care provider to check your cholesterol and your blood sugar (glucose). Continue to have your blood tested as directed by your health care provider. Do I need screening for cancer? Depending on your health history and family history, you may need to have cancer screening at different stages of your life. This   may include screening for:  Breast cancer.  Cervical cancer.  Lung cancer.  Colorectal cancer. What is my risk for osteoporosis? After menopause, you may be at increased risk for osteoporosis. Osteoporosis is a condition in which bone destruction happens more quickly than new bone creation. To help prevent  osteoporosis or the bone fractures that can happen because of osteoporosis, you may take the following actions:  If you are 19-50 years old, get at least 1,000 mg of calcium and at least 600 mg of vitamin D per day.  If you are older than age 50 but younger than age 70, get at least 1,200 mg of calcium and at least 600 mg of vitamin D per day.  If you are older than age 70, get at least 1,200 mg of calcium and at least 800 mg of vitamin D per day. Smoking and drinking excessive alcohol increase the risk of osteoporosis. Eat foods that are rich in calcium and vitamin D, and do weight-bearing exercises several times each week as directed by your health care provider. How does menopause affect my mental health? Depression may occur at any age, but it is more common as you become older. Common symptoms of depression include:  Low or sad mood.  Changes in sleep patterns.  Changes in appetite or eating patterns.  Feeling an overall lack of motivation or enjoyment of activities that you previously enjoyed.  Frequent crying spells. Talk with your health care provider if you think that you are experiencing depression. General instructions See your health care provider for regular wellness exams and vaccines. This may include:  Scheduling regular health, dental, and eye exams.  Getting and maintaining your vaccines. These include: ? Influenza vaccine. Get this vaccine each year before the flu season begins. ? Pneumonia vaccine. ? Shingles vaccine. ? Tetanus, diphtheria, and pertussis (Tdap) booster vaccine. Your health care provider may also recommend other immunizations. Tell your health care provider if you have ever been abused or do not feel safe at home. Summary  Menopause is a normal process in which your ability to get pregnant comes to an end.  This condition causes hot flashes, night sweats, decreased interest in sex, mood swings, headaches, or lack of sleep.  Treatment for this  condition may include hormone replacement therapy.  Take actions to keep yourself healthy, including exercising regularly, eating a healthy diet, watching your weight, and checking your blood pressure and blood sugar levels.  Get screened for cancer and depression. Make sure that you are up to date with all your vaccines. This information is not intended to replace advice given to you by your health care provider. Make sure you discuss any questions you have with your health care provider. Document Revised: 08/24/2018 Document Reviewed: 08/24/2018 Elsevier Patient Education  2020 Elsevier Inc.    Breast Self-Awareness Breast self-awareness means being familiar with how your breasts look and feel. It involves checking your breasts regularly and reporting any changes to your health care provider. Practicing breast self-awareness is important. Sometimes changes may not be harmful (are benign), but sometimes a change in your breasts can be a sign of a serious medical problem. It is important to learn how to do this procedure correctly so that you can catch problems early, when treatment is more likely to be successful. All women should practice breast self-awareness, including women who have had breast implants. What you need:  A mirror.  A well-lit room. How to do a breast self-exam A breast self-exam is   one way to learn what is normal for your breasts and whether your breasts are changing. To do a breast self-exam: Look for changes  1. Remove all the clothing above your waist. 2. Stand in front of a mirror in a room with good lighting. 3. Put your hands on your hips. 4. Push your hands firmly downward. 5. Compare your breasts in the mirror. Look for differences between them (asymmetry), such as: ? Differences in shape. ? Differences in size. ? Puckers, dips, and bumps in one breast and not the other. 6. Look at each breast for changes in the skin, such as: ? Redness. ? Scaly  areas. 7. Look for changes in your nipples, such as: ? Discharge. ? Bleeding. ? Dimpling. ? Redness. ? A change in position. Feel for changes Carefully feel your breasts for lumps and changes. It is best to do this while lying on your back on the floor, and again while sitting or standing in the tub or shower with soapy water on your skin. Feel each breast in the following way: 1. Place the arm on the side of the breast you are examining above your head. 2. Feel your breast with the other hand. 3. Start in the nipple area and make -inch (2 cm) overlapping circles to feel your breast. Use the pads of your three middle fingers to do this. Apply light pressure, then medium pressure, then firm pressure. The light pressure will allow you to feel the tissue closest to the skin. The medium pressure will allow you to feel the tissue that is a little deeper. The firm pressure will allow you to feel the tissue close to the ribs. 4. Continue the overlapping circles, moving downward over the breast until you feel your ribs below your breast. 5. Move one finger-width toward the center of the body. Continue to use the -inch (2 cm) overlapping circles to feel your breast as you move slowly up toward your collarbone. 6. Continue the up-and-down exam using all three pressures until you reach your armpit.  Write down what you find Writing down what you find can help you remember what to discuss with your health care provider. Write down:  What is normal for each breast.  Any changes that you find in each breast, including: ? The kind of changes you find. ? Any pain or tenderness. ? Size and location of any lumps.  Where you are in your menstrual cycle, if you are still menstruating. General tips and recommendations  Examine your breasts every month.  If you are breastfeeding, the best time to examine your breasts is after a feeding or after using a breast pump.  If you menstruate, the best time to  examine your breasts is 5-7 days after your period. Breasts are generally lumpier during menstrual periods, and it may be more difficult to notice changes.  With time and practice, you will become more familiar with the variations in your breasts and more comfortable with the exam. Contact a health care provider if you:  See a change in the shape or size of your breasts or nipples.  See a change in the skin of your breast or nipples, such as a reddened or scaly area.  Have unusual discharge from your nipples.  Find a lump or thick area that was not there before.  Have pain in your breasts.  Have any concerns related to your breast health. Summary  Breast self-awareness includes looking for physical changes in your breasts, as well   as feeling for any changes within your breasts.  Breast self-awareness should be performed in front of a mirror in a well-lit room.  You should examine your breasts every month. If you menstruate, the best time to examine your breasts is 5-7 days after your menstrual period.  Let your health care provider know of any changes you notice in your breasts, including changes in size, changes on the skin, pain or tenderness, or unusual fluid from your nipples. This information is not intended to replace advice given to you by your health care provider. Make sure you discuss any questions you have with your health care provider. Document Revised: 04/19/2018 Document Reviewed: 04/19/2018 Elsevier Patient Education  2020 Elsevier Inc.    Atrophic Vaginitis Atrophic vaginitis is a condition in which the tissues that line the vagina become dry and thin. This condition occurs in women who have stopped having their period. It is caused by a drop in a female hormone (estrogen). This hormone helps:  To keep the vagina moist.  To make a clear fluid. This clear fluid helps: ? To make the vagina ready for sex. ? To protect the vagina from infection. If the lining of  the vagina is dry and thin, it may cause irritation, burning, or itchiness. It may also:  Make sex painful.  Make an exam of your vagina painful.  Cause bleeding.  Make you lose interest in sex.  Cause a burning feeling when you pee (urinate).  Cause a brown or yellow fluid to come from your vagina. Some women do not have symptoms. Follow these instructions at home: Medicines  Take over-the-counter and prescription medicines only as told by your doctor.  Do not use herbs or other medicines unless your doctor says it is okay.  Use medicines for for dryness. These include: ? Oils to make the vagina soft. ? Creams. ? Moisturizers. General instructions  Do not douche.  Do not use products that can make your vagina dry. These include: ? Scented sprays. ? Scented tampons. ? Scented soaps.  Sex can help increase blood flow and soften the tissue in the vagina. If it hurts to have sex: ? Tell your partner. ? Use products to make sex more comfortable. Use these only as told by your doctor. Contact a doctor if you:  Have discharge from the vagina that is different than usual.  Have a bad smell coming from your vagina.  Have new symptoms.  Do not get better.  Get worse. Summary  Atrophic vaginitis is a condition in which the lining of the vagina becomes dry and thin.  This condition affects women who have stopped having their periods.  Treatment may include using products that help make the vagina soft.  Call a doctor if do not get better with treatment. This information is not intended to replace advice given to you by your health care provider. Make sure you discuss any questions you have with your health care provider. Document Revised: 09/13/2017 Document Reviewed: 09/13/2017 Elsevier Patient Education  2020 [employer].

## 2020-08-13 NOTE — Progress Notes (Signed)
ANNUAL PREVENTATIVE CARE GYNECOLOGY  ENCOUNTER NOTE  Subjective:       Adrienne Horne is a 59 y.o. [phone] female here for a routine annual gynecologic exam. The patient is sexually active. The patient is not taking hormone replacement therapy. Patient denies post-menopausal vaginal bleeding. The patient wears seatbelts: yes. The patient participates in regular exercise: yes. Has the patient ever been transfused or tattooed?: no. The patient reports that there is not domestic violence in her life.  Current complaints: 1. Patient reports vaginal dryness x 8 months. Also more recently noting burning with urination.  2. Patient reports that her thyroid levels have been elevated recently, is unsure if she needs to start medication or not.  Notes her PCP informed her that she didn't need to at this time, but do to her family history of thyroid disease, just would like a second opinion.    Gynecologic History No LMP recorded. Patient is postmenopausal. Contraception: post menopausal status and vasectomy Last Pap: 01/2017. Results were: normal Last mammogram: 03/24/2020. Results were: normal Last Colonoscopy: 04/2015. Results were: normal.    Obstetric History OB History  Gravida Para Term Preterm AB Living  2 2 2     2   SAB TAB Ectopic Multiple Live Births          2    # Outcome Date GA Lbr Len/2nd Weight Sex Delivery Anes PTL Lv  2 Term    7 lb 8 oz (3.402 kg) F Vag-Spont   LIV     Complications: Endometritis affecting pregnancy  1 Term    5 lb 9.6 oz (2.54 kg) F Vag-Spont   LIV    Past Medical History:  Diagnosis Date  . Arthritis    arthritis in knees  . Arthritis   . Chronic left-sided headaches    UNCLEAR ETIOLOGY  . Constipation   . Family history of breast cancer   . Fever blister   . Folliculitis   . GERD (gastroesophageal reflux disease)   . Goiter   . Hashimoto's disease   . Increased BMI   . Menopause   . Plantar fasciitis   . Vaginal atrophy     Family  History  Problem Relation Age of Onset  . Breast cancer Mother 43  . Heart disease Mother   . Pancreatic cancer Father   . Hypothyroidism Sister   . Diabetes Neg Hx   . Colon cancer Neg Hx   . Ovarian cancer Neg Hx     Past Surgical History:  Procedure Laterality Date  . GYNECOLOGIC CRYOSURGERY    . TONSILLECTOMY      Social History   Socioeconomic History  . Marital status: Married    Spouse name: Not on file  . Number of children: Not on file  . Years of education: Not on file  . Highest education level: Not on file  Occupational History  . Not on file  Tobacco Use  . Smoking status: Never Smoker  . Smokeless tobacco: Never Used  Vaping Use  . Vaping Use: Never used  Substance and Sexual Activity  . Alcohol use: No  . Drug use: No  . Sexual activity: Yes    Birth control/protection: Other-see comments    Comment: VASECTOMY  Other Topics Concern  . Not on file  Social History Narrative  . Not on file   Social Determinants of Health   Financial Resource Strain:   . Difficulty of Paying Living Expenses: Not on file  Food  Insecurity:   . Worried About Programme researcher, broadcasting/film/video in the Last Year: Not on file  . Ran Out of Food in the Last Year: Not on file  Transportation Needs:   . Lack of Transportation (Medical): Not on file  . Lack of Transportation (Non-Medical): Not on file  Physical Activity:   . Days of Exercise per Week: Not on file  . Minutes of Exercise per Session: Not on file  Stress:   . Feeling of Stress : Not on file  Social Connections:   . Frequency of Communication with Friends and Family: Not on file  . Frequency of Social Gatherings with Friends and Family: Not on file  . Attends Religious Services: Not on file  . Active Member of Clubs or Organizations: Not on file  . Attends Banker Meetings: Not on file  . Marital Status: Not on file  Intimate Partner Violence:   . Fear of Current or Ex-Partner: Not on file  . Emotionally  Abused: Not on file  . Physically Abused: Not on file  . Sexually Abused: Not on file    Current Outpatient Medications on File Prior to Visit  Medication Sig Dispense Refill  . etodolac (LODINE) 200 MG capsule Take 200 mg by mouth every 8 (eight) hours.    . fluticasone (FLONASE) 50 MCG/ACT nasal spray Place 2 sprays into the nose daily as needed for rhinitis.     Marland Kitchen HYDROcodone-ibuprofen (VICOPROFEN) 7.5-200 MG per tablet Take 1 tablet by mouth 2 (two) times daily as needed for severe pain.     . pantoprazole (PROTONIX) 40 MG tablet Take 1 tablet by mouth daily.    . valACYclovir (VALTREX) 1000 MG tablet Take 2 tablets by mouth 2 (two) times daily as needed (cold sores).      No current facility-administered medications on file prior to visit.    Allergies  Allergen Reactions  . Penicillin G Rash    In high doses.  Ok to take amoxicillin      Review of Systems ROS Review of Systems - General ROS: negative for - chills, fatigue, fever, hot flashes, night sweats, weight gain or weight loss Psychological ROS: negative for - anxiety, decreased libido, depression, mood swings, physical abuse or sexual abuse Ophthalmic ROS: negative for - blurry vision, eye pain or loss of vision ENT ROS: negative for - headaches, hearing change, visual changes or vocal changes Allergy and Immunology ROS: negative for - hives, itchy/watery eyes or seasonal allergies Hematological and Lymphatic ROS: negative for - bleeding problems, bruising, swollen lymph nodes or weight loss Endocrine ROS: negative for - galactorrhea, hair pattern changes, hot flashes, malaise/lethargy, mood swings, palpitations, polydipsia/polyuria, skin changes, temperature intolerance or unexpected weight changes Breast ROS: negative for - new or changing breast lumps or nipple discharge Respiratory ROS: negative for - cough or shortness of breath Cardiovascular ROS: negative for - chest pain, irregular heartbeat, palpitations or  shortness of breath Gastrointestinal ROS: no abdominal pain, change in bowel habits, or black or bloody stools Genito-Urinary ROS: no dysuria, trouble voiding, or hematuria Musculoskeletal ROS: positive for - joint pain and joint stiffness in her knees bilaterally (thinks it is her arthritis).  Neurological ROS: negative for - bowel and bladder control changes Dermatological ROS: negative for rash and skin lesion changes   Objective:   BP 106/69   Pulse 82   Ht 5\' 6"  (1.676 m)   Wt 169 lb 4.8 oz (76.8 kg)   BMI 27.33 kg/m  CONSTITUTIONAL: Well-developed, well-nourished female in no acute distress. Mild obesity PSYCHIATRIC: Normal mood and affect. Normal behavior. Normal judgment and thought content. NEUROLGIC: Alert and oriented to person, place, and time. Normal muscle tone coordination. No cranial nerve deficit noted. HENT:  Normocephalic, atraumatic, External right and left ear normal. Oropharynx is clear and moist EYES: Conjunctivae and EOM are normal. Pupils are equal, round, and reactive to light. No scleral icterus.  NECK: Normal range of motion, supple, no masses.  Normal thyroid.  SKIN: Skin is warm and dry. No rash noted. Not diaphoretic. No erythema. No pallor. CARDIOVASCULAR: Normal heart rate noted, regular rhythm, no murmur. RESPIRATORY: Clear to auscultation bilaterally. Effort and breath sounds normal, no problems with respiration noted. BREASTS: Symmetric in size. No masses, skin changes, nipple drainage, or lymphadenopathy. ABDOMEN: Soft, normal bowel sounds, no distention noted.  No tenderness, rebound or guarding.  BLADDER: Normal PELVIC:  Bladder no bladder distension noted  Urethra: normal appearing urethra with no masses, tenderness or lesions  Vulva: normal appearing vulva with no masses, tenderness or lesions  Vagina: mild to moderate vaginal atrophy, no lesions or discharge  Cervix: normal appearing cervix without discharge or lesions  Uterus: uterus is  normal size, shape, consistency and nontender  Adnexa: normal adnexa in size, nontender and no masses  RV: External Exam NormaI, No Rectal Masses and Normal Sphincter tone  MUSCULOSKELETAL: Normal range of motion. No tenderness.  No cyanosis, clubbing, or edema.  2+ distal pulses. LYMPHATIC: No Axillary, Supraclavicular, or Inguinal Adenopathy.   Labs: Labs reviewed in Care Everywhere   Results for orders placed or performed in visit on 08/13/20  POC Urinalysis Dipstick OB  Result Value Ref Range   Color, UA yellow    Clarity, UA clear    Glucose, UA Negative Negative   Bilirubin, UA large    Ketones, UA neg    Spec Grav, UA >=1.030 (A) 1.010 - 1.025   Blood, UA neg    pH, UA 6.0 5.0 - 8.0   POC,PROTEIN,UA Negative Negative, Trace, Small (1+), Moderate (2+), Large (3+), 4+   Urobilinogen, UA 0.2 0.2 or 1.0 E.U./dL   Nitrite, UA neg    Leukocytes, UA Moderate (2+) (A) Negative   Appearance yellow;clear    Odor       Assessment:   Encounter for well woman exam with routine gynecological exam  Family history of breast cancer in mother Screening for breast cancer Menopause Vaginal atrophy Subclinical hypothyroidism Obesity (BMI 30.0-34.9)  Plan:  Pap: Pap Co Test. Desires pap yearly despite screening recommendations of q 3-5 years and cost (if not covered by insurance).  Mammogram: Ordered Stool Guaiac Testing:  Not Indicated. Colonoscopy up to date.  Labs: No labs done. Reviewed in Care Everywhere.  Routine preventative health maintenance measures emphasized: Exercise/Diet/Weight control, Tobacco Warnings, Alcohol/Substance use risks, Stress Management, Peer Pressure Issues and Safe Sex.  Mild vaginal atrophy, becoming more symptomatic (genitourinary syndrome). UA overall negative today. Discussed options for treatment, including vaginal moisturizers, localized estrogen or testosterone therapy. Patient declines use of estrogen due to family history of breast cancer.  Given  info on other options, to send message on which option she desires.  Moisturizers are over the counter, prescription needed for local testosterone therapy.   Family history of cancer, discussed risk factors, option of genetic cancer screening if she desires to know personal risk.  Patient unsure if she desires testing. Continue to encourage encouraged 3D mammography. Monitor family for any other occurrences.  Subclinical  hypothyroidism - concurred that as long as she remains asymptomatic and TSH is below 10, no indications to treat at this time.  Flu vaccine up to date.  COVID vaccination status: completed.  For booster shot as she is eligible.  Return to Clinic - 1 Year   Hildred Laser, MD [facility] Care

## 2020-11-04 ENCOUNTER — Other Ambulatory Visit: Payer: Self-pay | Admitting: Internal Medicine

## 2020-11-04 DIAGNOSIS — D369 Benign neoplasm, unspecified site: Secondary | ICD-10-CM | POA: Insufficient documentation

## 2020-11-04 DIAGNOSIS — M170 Bilateral primary osteoarthritis of knee: Secondary | ICD-10-CM | POA: Insufficient documentation

## 2020-11-04 DIAGNOSIS — Z1231 Encounter for screening mammogram for malignant neoplasm of breast: Secondary | ICD-10-CM

## 2021-03-11 ENCOUNTER — Other Ambulatory Visit: Payer: Self-pay

## 2021-03-11 MED ORDER — PANTOPRAZOLE SODIUM 40 MG PO TBEC
DELAYED_RELEASE_TABLET | ORAL | 3 refills | Status: DC
  Filled 2021-03-11: qty 90, 90d supply, fill #0
  Filled 2021-06-15 – 2021-06-16 (×2): qty 90, 90d supply, fill #1
  Filled 2021-09-24: qty 90, 90d supply, fill #2
  Filled 2021-12-16: qty 90, 90d supply, fill #3

## 2021-03-27 ENCOUNTER — Other Ambulatory Visit: Payer: Self-pay

## 2021-03-27 ENCOUNTER — Ambulatory Visit
Admission: RE | Admit: 2021-03-27 | Discharge: 2021-03-27 | Disposition: A | Discharge status: Home / Self Care | Payer: Managed Care, Other (non HMO) | Source: Ambulatory Visit | Attending: Internal Medicine | Admitting: Internal Medicine

## 2021-03-27 DIAGNOSIS — Z1231 Encounter for screening mammogram for malignant neoplasm of breast: Secondary | ICD-10-CM | POA: Insufficient documentation

## 2021-04-09 ENCOUNTER — Other Ambulatory Visit: Payer: Self-pay

## 2021-04-16 ENCOUNTER — Other Ambulatory Visit: Payer: Self-pay

## 2021-06-16 ENCOUNTER — Other Ambulatory Visit: Payer: Self-pay

## 2021-08-26 ENCOUNTER — Encounter: Payer: BC Managed Care – PPO | Admitting: Obstetrics and Gynecology

## 2021-09-24 ENCOUNTER — Other Ambulatory Visit: Payer: Self-pay

## 2021-10-08 ENCOUNTER — Other Ambulatory Visit: Payer: Self-pay

## 2021-10-08 ENCOUNTER — Ambulatory Visit (INDEPENDENT_AMBULATORY_CARE_PROVIDER_SITE_OTHER): Payer: Managed Care, Other (non HMO) | Admitting: Obstetrics and Gynecology

## 2021-10-08 ENCOUNTER — Other Ambulatory Visit (HOSPITAL_COMMUNITY)
Admission: RE | Admit: 2021-10-08 | Discharge: 2021-10-08 | Disposition: A | Discharge status: Home / Self Care | Payer: Managed Care, Other (non HMO) | Source: Ambulatory Visit | Attending: Obstetrics and Gynecology | Admitting: Obstetrics and Gynecology

## 2021-10-08 ENCOUNTER — Encounter: Payer: Self-pay | Admitting: Obstetrics and Gynecology

## 2021-10-08 VITALS — BP 104/70 | HR 76 | Ht 66.0 in | Wt 173.0 lb

## 2021-10-08 DIAGNOSIS — N952 Postmenopausal atrophic vaginitis: Secondary | ICD-10-CM | POA: Diagnosis present

## 2021-10-08 DIAGNOSIS — Z803 Family history of malignant neoplasm of breast: Secondary | ICD-10-CM | POA: Insufficient documentation

## 2021-10-08 DIAGNOSIS — Z01419 Encounter for gynecological examination (general) (routine) without abnormal findings: Secondary | ICD-10-CM | POA: Insufficient documentation

## 2021-10-08 DIAGNOSIS — Z124 Encounter for screening for malignant neoplasm of cervix: Secondary | ICD-10-CM

## 2021-10-08 DIAGNOSIS — E663 Overweight: Secondary | ICD-10-CM

## 2021-10-08 DIAGNOSIS — E038 Other specified hypothyroidism: Secondary | ICD-10-CM

## 2021-10-08 NOTE — Addendum Note (Signed)
Addended by: Shelly Bombard on: 10/08/2021 11:58 AM   Modules accepted: Orders

## 2021-10-08 NOTE — Progress Notes (Signed)
ANNUAL PREVENTATIVE CARE GYNECOLOGY  ENCOUNTER NOTE  Subjective:       Adrienne Horne is a 61 y.o. [phone] female here for a routine annual gynecologic exam. The patient is sexually active. The patient is not taking hormone replacement therapy. Patient denies post-menopausal vaginal bleeding. The patient wears seatbelts: yes. The patient participates in regular exercise: yes. Has the patient ever been transfused or tattooed?: no. The patient reports that there is not domestic violence in her life.   Current complaints: 1. None    Gynecologic History No LMP recorded. Patient is postmenopausal. Contraception: post menopausal status and vasectomy Last Pap: 06/06/2019. Results were: normal. Last mammogram: 03/27/2021. Results were: normal Last Colonoscopy: 2022. Results were: normal. Repeat q 5 years.    Obstetric History OB History  Gravida Para Term Preterm AB Living  2 2 2     2   SAB IAB Ectopic Multiple Live Births          2    # Outcome Date GA Lbr Len/2nd Weight Sex Delivery Anes PTL Lv  2 Term    7 lb 8 oz (3.402 kg) F Vag-Spont   LIV     Complications: Endometritis affecting pregnancy  1 Term    5 lb 9.6 oz (2.54 kg) F Vag-Spont   LIV    Past Medical History:  Diagnosis Date   Arthritis    arthritis in knees   Arthritis    Chronic left-sided headaches    UNCLEAR ETIOLOGY   Constipation    Family history of breast cancer    Fever blister    Folliculitis    GERD (gastroesophageal reflux disease)    Goiter    Hashimoto's disease    Increased BMI    Menopause    Plantar fasciitis    Vaginal atrophy     Family History  Problem Relation Age of Onset   Breast cancer Mother 23   Heart disease Mother    Pancreatic cancer Father    Hypothyroidism Sister    Diabetes Neg Hx    Colon cancer Neg Hx    Ovarian cancer Neg Hx     Past Surgical History:  Procedure Laterality Date   GYNECOLOGIC CRYOSURGERY     TONSILLECTOMY      Social History    Socioeconomic History   Marital status: Married    Spouse name: Not on file   Number of children: Not on file   Years of education: Not on file   Highest education level: Not on file  Occupational History   Not on file  Tobacco Use   Smoking status: Never   Smokeless tobacco: Never  Vaping Use   Vaping Use: Never used  Substance and Sexual Activity   Alcohol use: No   Drug use: No   Sexual activity: Yes    Birth control/protection: Other-see comments    Comment: VASECTOMY  Other Topics Concern   Not on file  Social History Narrative   Not on file   Social Determinants of Health   Financial Resource Strain: Not on file  Food Insecurity: Not on file  Transportation Needs: Not on file  Physical Activity: Not on file  Stress: Not on file  Social Connections: Not on file  Intimate Partner Violence: Not on file    Current Outpatient Medications on File Prior to Visit  Medication Sig Dispense Refill   HYDROcodone-ibuprofen (VICOPROFEN) 7.5-200 MG per tablet Take 1 tablet by mouth 2 (two) times daily as needed  for severe pain.      pantoprazole (PROTONIX) 40 MG tablet Take 1 tablet (40 mg total) by mouth once daily 90 tablet 3   valACYclovir (VALTREX) 1000 MG tablet Take 2 tablets by mouth 2 (two) times daily as needed (cold sores).      pantoprazole (PROTONIX) 40 MG tablet Take 1 tablet by mouth daily.     No current facility-administered medications on file prior to visit.    Allergies  Allergen Reactions   Penicillin G Rash    In high doses.  Ok to take amoxicillin      Review of Systems ROS Review of Systems - General ROS: negative for - chills, fatigue, fever, hot flashes, night sweats, weight gain or weight loss Psychological ROS: negative for - anxiety, decreased libido, depression, mood swings, physical abuse or sexual abuse Ophthalmic ROS: negative for - blurry vision, eye pain or loss of vision ENT ROS: negative for - headaches, hearing change, visual  changes or vocal changes Allergy and Immunology ROS: negative for - hives, itchy/watery eyes or seasonal allergies Hematological and Lymphatic ROS: negative for - bleeding problems, bruising, swollen lymph nodes or weight loss Endocrine ROS: negative for - galactorrhea, hair pattern changes, hot flashes, malaise/lethargy, mood swings, palpitations, polydipsia/polyuria, skin changes, temperature intolerance or unexpected weight changes Breast ROS: negative for - new or changing breast lumps or nipple discharge Respiratory ROS: negative for - cough or shortness of breath Cardiovascular ROS: negative for - chest pain, irregular heartbeat, palpitations or shortness of breath Gastrointestinal ROS: no abdominal pain, change in bowel habits, or black or bloody stools Genito-Urinary ROS: no dysuria, trouble voiding, or hematuria Musculoskeletal ROS: negative for - joint pain and joint stiffness  Neurological ROS: negative for - bowel and bladder control changes Dermatological ROS: negative for rash and skin lesion changes   Objective:   BP 104/70    Pulse 76    Ht 5\' 6"  (1.676 m)    Wt 173 lb (78.5 kg)    SpO2 96%    BMI 27.92 kg/m  CONSTITUTIONAL: Well-developed, well-nourished female in no acute distress. Overweight PSYCHIATRIC: Normal mood and affect. Normal behavior. Normal judgment and thought content. Lamb: Alert and oriented to person, place, and time. Normal muscle tone coordination. No cranial nerve deficit noted. HENT:  Normocephalic, atraumatic, External right and left ear normal. Oropharynx is clear and moist EYES: Conjunctivae and EOM are normal. Pupils are equal, round, and reactive to light. No scleral icterus.  NECK: Normal range of motion, supple, no masses.  Normal thyroid.  SKIN: Skin is warm and dry. No rash noted. Not diaphoretic. No erythema. No pallor. CARDIOVASCULAR: Normal heart rate noted, regular rhythm, no murmur. RESPIRATORY: Clear to auscultation bilaterally.  Effort and breath sounds normal, no problems with respiration noted. BREASTS: Symmetric in size. No masses, skin changes, nipple drainage, or lymphadenopathy. ABDOMEN: Soft, normal bowel sounds, no distention noted.  No tenderness, rebound or guarding.  BLADDER: Normal PELVIC:  Bladder no bladder distension noted  Urethra: normal appearing urethra with no masses, tenderness or lesions  Vulva: normal appearing vulva with no masses, tenderness or lesions  Vagina: mild to moderate vaginal atrophy, no lesions or discharge  Cervix: normal appearing cervix without discharge or lesions  Uterus: uterus is normal size, shape, consistency and nontender  Adnexa: normal adnexa in size, nontender and no masses  RV: External Exam NormaI, No Rectal Masses and Normal Sphincter tone  MUSCULOSKELETAL: Normal range of motion. No tenderness.  No cyanosis, clubbing, or  edema.  2+ distal pulses. LYMPHATIC: No Axillary, Supraclavicular, or Inguinal Adenopathy.   Labs: Labs reviewed in Care Everywhere   Results for orders placed or performed in visit on 08/13/20  POC Urinalysis Dipstick OB  Result Value Ref Range   Color, UA yellow    Clarity, UA clear    Glucose, UA Negative Negative   Bilirubin, UA large    Ketones, UA neg    Spec Grav, UA >=1.030 (A) 1.010 - 1.025   Blood, UA neg    pH, UA 6.0 5.0 - 8.0   POC,PROTEIN,UA Negative Negative, Trace, Small (1+), Moderate (2+), Large (3+), 4+   Urobilinogen, UA 0.2 0.2 or 1.0 E.U./dL   Nitrite, UA neg    Leukocytes, UA Moderate (2+) (A) Negative   Appearance yellow;clear    Odor       Assessment:   Encounter for well woman exam with routine gynecological exam  Family history of breast cancer in mother Screening for breast cancer Menopause Vaginal atrophy Subclinical hypothyroidism Obesity (BMI 30.0-34.9)  Plan:  - Pap: Pap Co Test. Desires pap yearly despite screening recommendations of q 3-5 years and cost (aware that it will not likely be  covered by insurance).  - Mammogram: Ordered - Stool Guaiac Testing:  Not Indicated. Colonoscopy up to date.  - Labs: No labs done. Reviewed in [facility].  - Routine preventative health maintenance measures emphasized: Exercise/Diet/Weight control, Tobacco Warnings, Alcohol/Substance use risks, Stress Management, Peer Pressure Issues and Safe Sex.  - Mild vaginal atrophy, not bothersome at this time.  - Family history of cancer, continue preventative screenings. Has previously been discussed regarding genetic testing.  Subclinical hypothyroidism - currently on levothyroxine, managed by PCP.  COVID vaccination status: completed. Up to date with booster.  Flu vaccine completed.  Return to [place], MD [facility]

## 2021-10-13 LAB — CYTOLOGY - PAP
Comment: NEGATIVE
Diagnosis: UNDETERMINED — AB
High risk HPV: NEGATIVE

## 2021-11-04 ENCOUNTER — Other Ambulatory Visit: Payer: Self-pay

## 2021-11-05 ENCOUNTER — Other Ambulatory Visit: Payer: Self-pay

## 2021-11-05 MED ORDER — AMOXICILLIN 500 MG PO CAPS
ORAL_CAPSULE | ORAL | 0 refills | Status: DC
  Filled 2021-11-05: qty 21, 7d supply, fill #0

## 2021-11-17 ENCOUNTER — Other Ambulatory Visit: Payer: Self-pay

## 2021-11-17 MED ORDER — TRIAZOLAM 0.25 MG PO TABS
ORAL_TABLET | ORAL | 0 refills | Status: DC
  Filled 2021-11-17: qty 2, 1d supply, fill #0

## 2021-11-17 MED ORDER — PANTOPRAZOLE SODIUM 40 MG PO TBEC
DELAYED_RELEASE_TABLET | ORAL | 3 refills | Status: DC
  Filled 2021-11-17: qty 90, 90d supply, fill #0
  Filled 2022-06-17: qty 90, 90d supply, fill #1
  Filled 2022-09-16: qty 90, 90d supply, fill #2

## 2021-12-16 ENCOUNTER — Other Ambulatory Visit: Payer: Self-pay

## 2022-02-19 ENCOUNTER — Other Ambulatory Visit: Payer: Self-pay | Admitting: Internal Medicine

## 2022-02-19 DIAGNOSIS — Z1231 Encounter for screening mammogram for malignant neoplasm of breast: Secondary | ICD-10-CM

## 2022-03-19 ENCOUNTER — Other Ambulatory Visit: Payer: Self-pay

## 2022-03-19 MED ORDER — FLUCONAZOLE 150 MG PO TABS
ORAL_TABLET | ORAL | 0 refills | Status: DC
  Filled 2022-03-19: qty 2, 2d supply, fill #0

## 2022-03-20 ENCOUNTER — Other Ambulatory Visit: Payer: Self-pay

## 2022-03-20 MED ORDER — LEVOFLOXACIN 750 MG PO TABS
ORAL_TABLET | ORAL | 0 refills | Status: DC
  Filled 2022-03-20: qty 7, 7d supply, fill #0

## 2022-03-30 ENCOUNTER — Ambulatory Visit
Admission: RE | Admit: 2022-03-30 | Discharge: 2022-03-30 | Disposition: A | Discharge status: Home / Self Care | Payer: Managed Care, Other (non HMO) | Source: Ambulatory Visit | Attending: Internal Medicine | Admitting: Internal Medicine

## 2022-03-30 DIAGNOSIS — Z1231 Encounter for screening mammogram for malignant neoplasm of breast: Secondary | ICD-10-CM | POA: Insufficient documentation

## 2022-06-17 ENCOUNTER — Other Ambulatory Visit: Payer: Self-pay

## 2022-08-31 IMAGING — MG MM DIGITAL SCREENING BILAT W/ TOMO AND CAD
8 series · 8 of 24 positions shown · non-contrast
Comparison: Previous exam(s).

CLINICAL DATA: Screening.

EXAM:
DIGITAL SCREENING BILATERAL MAMMOGRAM WITH TOMOSYNTHESIS AND CAD
TECHNIQUE: Bilateral screening digital craniocaudal and mediolateral oblique
mammograms were obtained. Bilateral screening digital breast
tomosynthesis was performed. The images were evaluated with
computer-aided detection.

[R MLO synth-2D (1 of 2)]
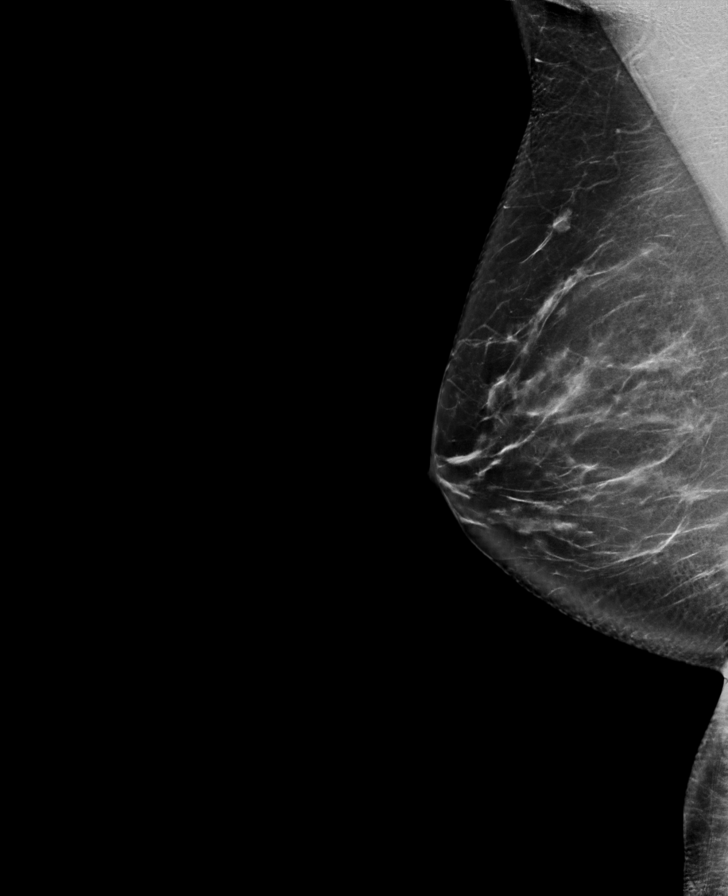

[R MLO synth-2D (2 of 2)]
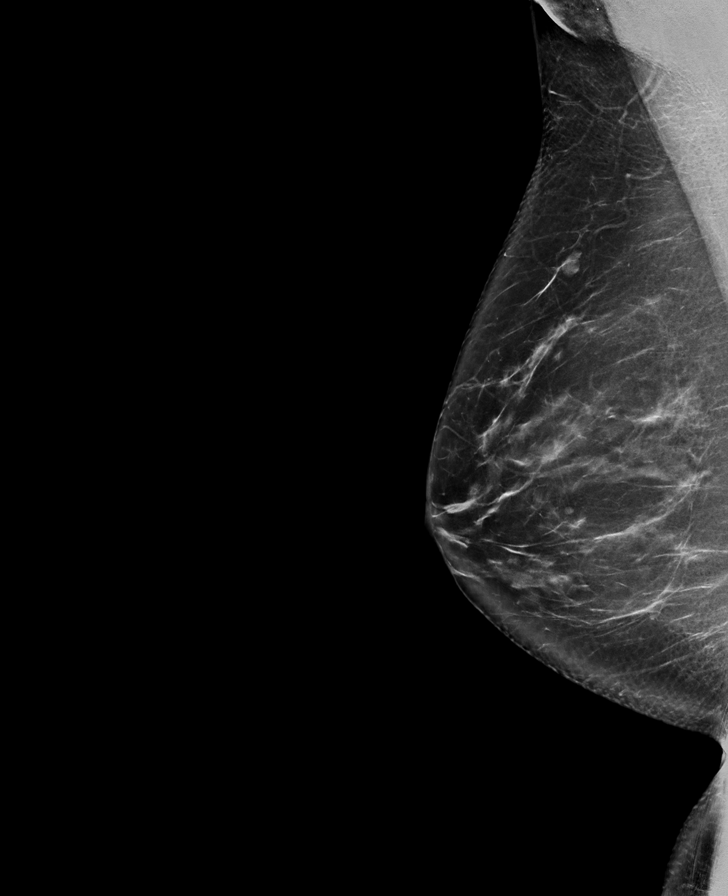

[R CC synth-2D]
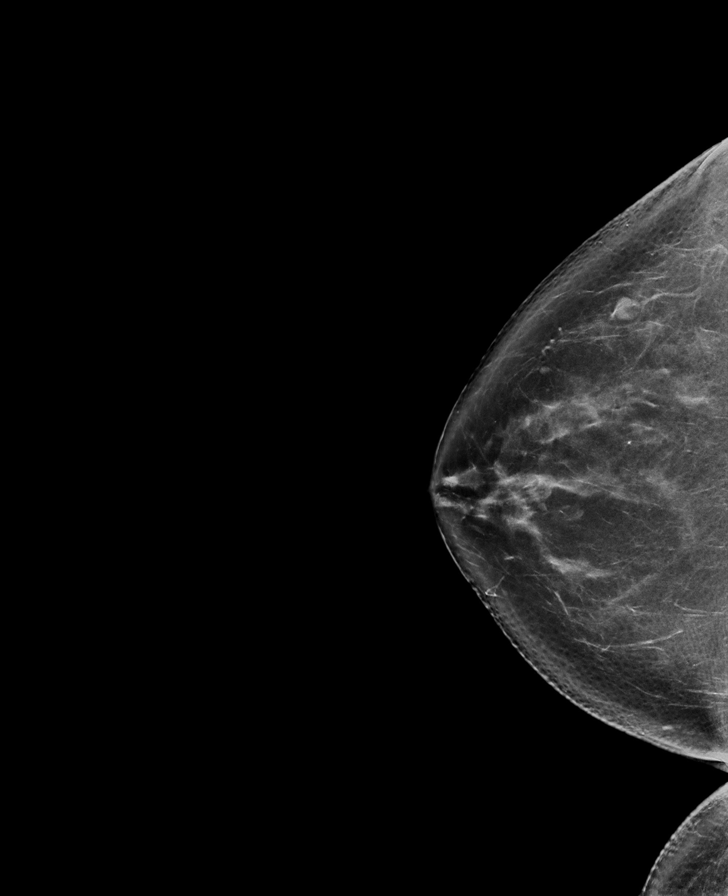

[L CC synth-2D]
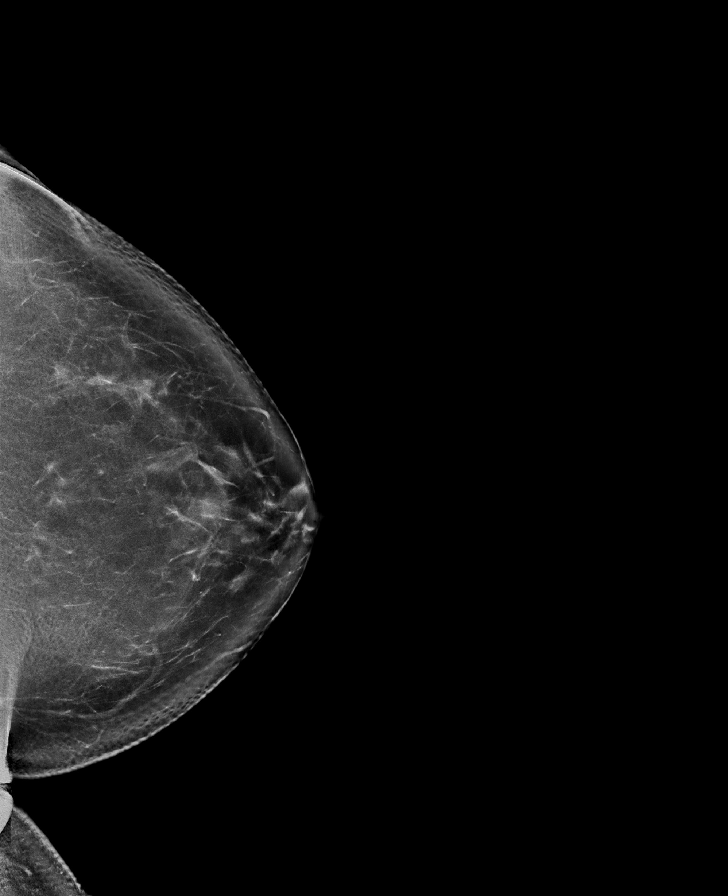

[R MLO tomo · tomo slice 43/86.0]
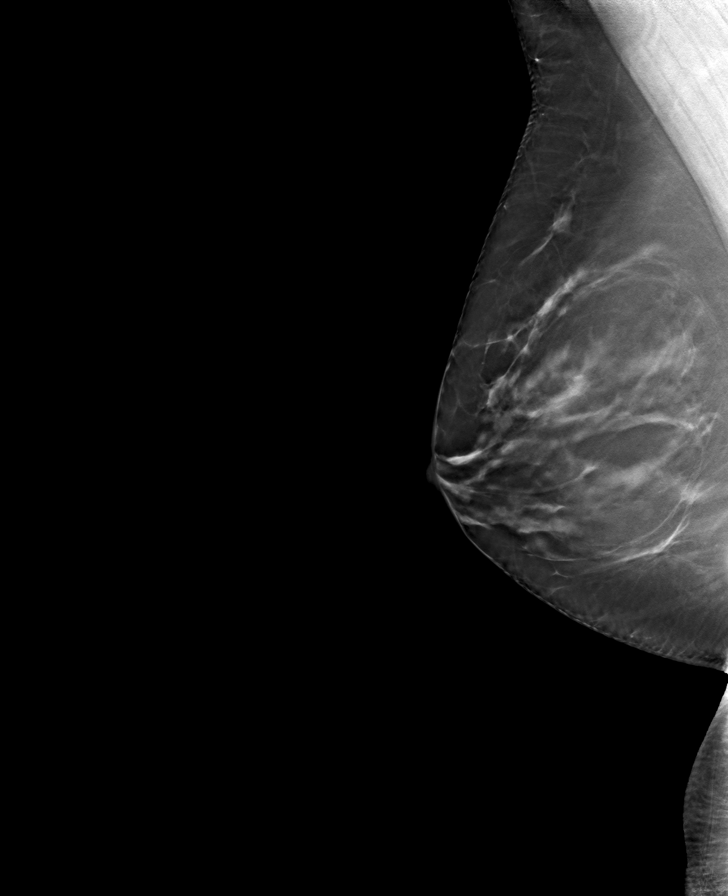

[L CC tomo · tomo slice 47/92.0]
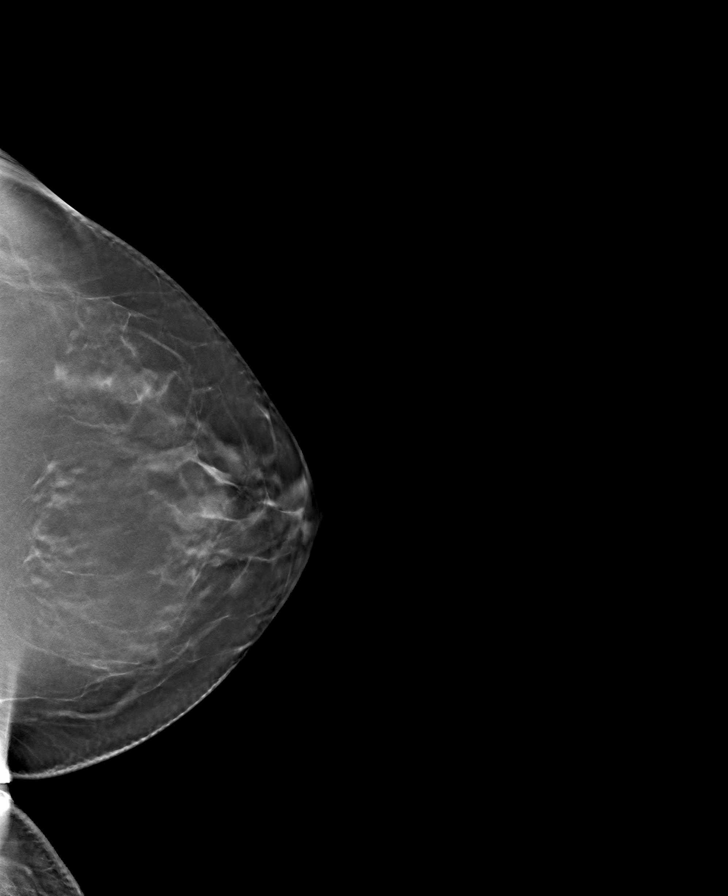

[R CC tomo · tomo slice 45/89.0]
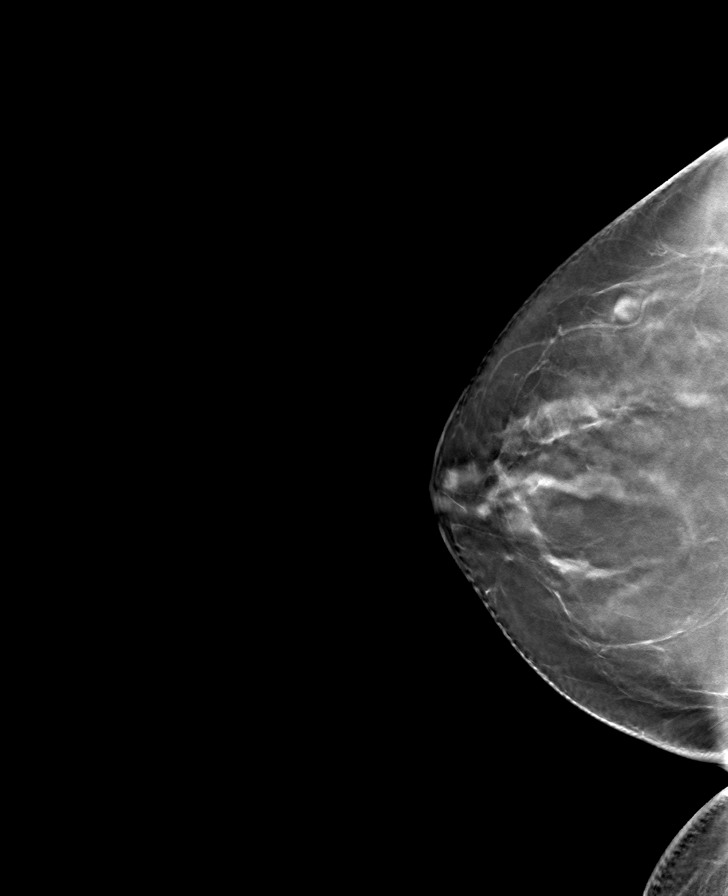

[L MLO tomo · tomo slice 41/81.0]
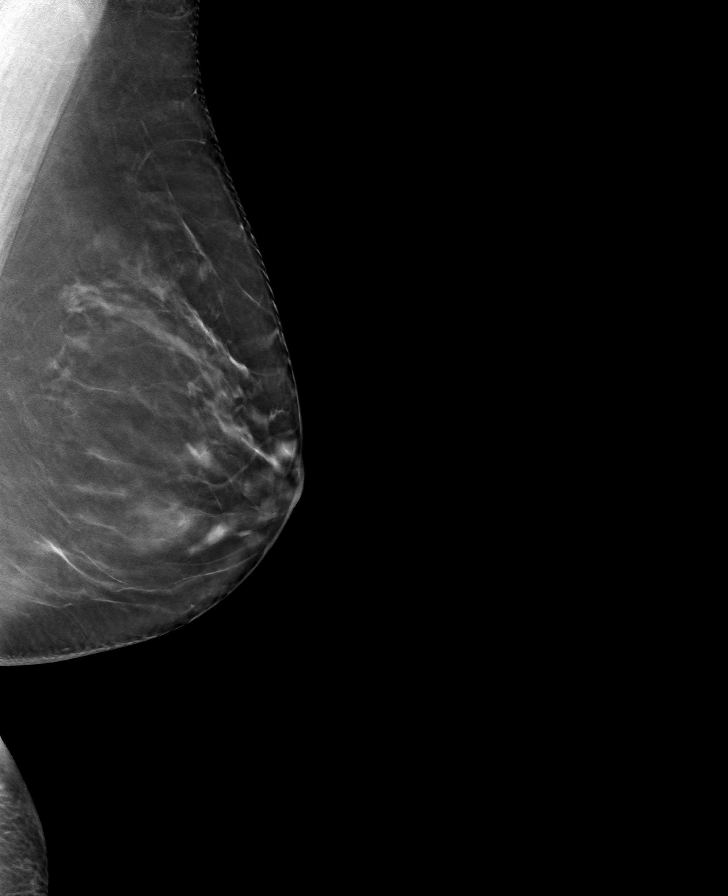

[8 of 24 positions shown; findings below may reference images not displayed]

ACR Breast Density Category b: There are scattered areas of
fibroglandular density.
FINDINGS: There are no findings suspicious for malignancy.
IMPRESSION: No mammographic evidence of malignancy. A result letter of this
screening mammogram will be mailed directly to the patient.

RECOMMENDATION:
Screening mammogram in one year. (Code:51-O-LD2)

BI-RADS CATEGORY  1: Negative.

## 2022-09-16 ENCOUNTER — Other Ambulatory Visit: Payer: Self-pay

## 2022-10-16 NOTE — Patient Instructions (Incomplete)
Preventive Care 40-60 Years Old, Female Preventive care refers to lifestyle choices and visits with your health care provider that can promote health and wellness. Preventive care visits are also called wellness exams. What can I expect for my preventive care visit? Counseling Your health care provider may ask you questions about your: Medical history, including: Past medical problems. Family medical history. Pregnancy history. Current health, including: Menstrual cycle. Method of birth control. Emotional well-being. Home life and relationship well-being. Sexual activity and sexual health. Lifestyle, including: Alcohol, nicotine or tobacco, and drug use. Access to firearms. Diet, exercise, and sleep habits. Work and work environment. Sunscreen use. Safety issues such as seatbelt and bike helmet use. Physical exam Your health care provider will check your: Height and weight. These may be used to calculate your BMI (body mass index). BMI is a measurement that tells if you are at a healthy weight. Waist circumference. This measures the distance around your waistline. This measurement also tells if you are at a healthy weight and may help predict your risk of certain diseases, such as type 2 diabetes and high blood pressure. Heart rate and blood pressure. Body temperature. Skin for abnormal spots. What immunizations do I need?  Vaccines are usually given at various ages, according to a schedule. Your health care provider will recommend vaccines for you based on your age, medical history, and lifestyle or other factors, such as travel or where you work. What tests do I need? Screening Your health care provider may recommend screening tests for certain conditions. This may include: Lipid and cholesterol levels. Diabetes screening. This is done by checking your blood sugar (glucose) after you have not eaten for a while (fasting). Pelvic exam and Pap test. Hepatitis B test. Hepatitis C  test. HIV (human immunodeficiency virus) test. STI (sexually transmitted infection) testing, if you are at risk. Lung cancer screening. Colorectal cancer screening. Mammogram. Talk with your health care provider about when you should start having regular mammograms. This may depend on whether you have a family history of breast cancer. BRCA-related cancer screening. This may be done if you have a family history of breast, ovarian, tubal, or peritoneal cancers. Bone density scan. This is done to screen for osteoporosis. Talk with your health care provider about your test results, treatment options, and if necessary, the need for more tests. Follow these instructions at home: Eating and drinking  Eat a diet that includes fresh fruits and vegetables, whole grains, lean protein, and low-fat dairy products. Take vitamin and mineral supplements as recommended by your health care provider. Do not drink alcohol if: Your health care provider tells you not to drink. You are pregnant, may be pregnant, or are planning to become pregnant. If you drink alcohol: Limit how much you have to 0-1 drink a day. Know how much alcohol is in your drink. In the [place] one drink equals one 12 oz bottle of beer (355 mL), one 5 oz glass of wine (148 mL), or one 1 oz glass of hard liquor (44 mL). Lifestyle Brush your teeth every morning and night with fluoride toothpaste. Floss one time each day. Exercise for at least 30 minutes 5 or more days each week. Do not use any products that contain nicotine or tobacco. These products include cigarettes, chewing tobacco, and vaping devices, such as e-cigarettes. If you need help quitting, ask your health care provider. Do not use drugs. If you are sexually active, practice safe sex. Use a condom or other form of protection to   prevent STIs. If you do not wish to become pregnant, use a form of birth control. If you plan to become pregnant, see your health care provider for a  prepregnancy visit. Take aspirin only as told by your health care provider. Make sure that you understand how much to take and what form to take. Work with your health care provider to find out whether it is safe and beneficial for you to take aspirin daily. Find healthy ways to manage stress, such as: Meditation, yoga, or listening to music. Journaling. Talking to a trusted person. Spending time with friends and family. Minimize exposure to UV radiation to reduce your risk of skin cancer. Safety Always wear your seat belt while driving or riding in a vehicle. Do not drive: If you have been drinking alcohol. Do not ride with someone who has been drinking. When you are tired or distracted. While texting. If you have been using any mind-altering substances or drugs. Wear a helmet and other protective equipment during sports activities. If you have firearms in your house, make sure you follow all gun safety procedures. Seek help if you have been physically or sexually abused. What's next? Visit your health care provider once a year for an annual wellness visit. Ask your health care provider how often you should have your eyes and teeth checked. Stay up to date on all vaccines. This information is not intended to replace advice given to you by your health care provider. Make sure you discuss any questions you have with your health care provider. Document Revised: 02/26/2021 Document Reviewed: 02/26/2021 Elsevier Patient Education  2023 Elsevier Inc.  

## 2022-10-16 NOTE — Progress Notes (Unsigned)
ANNUAL PREVENTATIVE CARE GYNECOLOGY  ENCOUNTER NOTE  Subjective:       Adrienne Horne is a 62 y.o. [phone] female here for a routine annual gynecologic exam. The patient is sexually active. The patient is not taking hormone replacement therapy. Patient denies post-menopausal vaginal bleeding. The patient wears seatbelts: yes. The patient participates in regular exercise: yes. Has the patient ever been transfused or tattooed?: no. The patient reports that there is not domestic violence in her life.   Current complaints: 1.  ***    Gynecologic History No LMP recorded. Patient is postmenopausal. Contraception: vasectomy Last Pap: 10/08/2021. Results were: abnormal (ASC-US) Last mammogram: 03/30/2022. Results were: normal Last Colonoscopy: ? Never done. Last Dexa Scan:    Obstetric History OB History  Gravida Para Term Preterm AB Living  2 2 2     2   SAB IAB Ectopic Multiple Live Births          2    # Outcome Date GA Lbr Len/2nd Weight Sex Delivery Anes PTL Lv  2 Term    7 lb 8 oz (3.402 kg) F Vag-Spont   LIV     Complications: Endometritis affecting pregnancy  1 Term    5 lb 9.6 oz (2.54 kg) F Vag-Spont   LIV    Past Medical History:  Diagnosis Date   Arthritis    arthritis in knees   Arthritis    Chronic left-sided headaches    UNCLEAR ETIOLOGY   Constipation    Family history of breast cancer    Fever blister    Folliculitis    GERD (gastroesophageal reflux disease)    Goiter    Hashimoto's disease    Increased BMI    Menopause    Plantar fasciitis    Vaginal atrophy     Family History  Problem Relation Age of Onset   Breast cancer Mother 65   Heart disease Mother    Pancreatic cancer Father    Hypothyroidism Sister    Diabetes Neg Hx    Colon cancer Neg Hx    Ovarian cancer Neg Hx     Past Surgical History:  Procedure Laterality Date   GYNECOLOGIC CRYOSURGERY     TONSILLECTOMY      Social History   Socioeconomic History   Marital status:  Married    Spouse name: Not on file   Number of children: Not on file   Years of education: Not on file   Highest education level: Not on file  Occupational History   Not on file  Tobacco Use   Smoking status: Never   Smokeless tobacco: Never  Vaping Use   Vaping Use: Never used  Substance and Sexual Activity   Alcohol use: No   Drug use: No   Sexual activity: Yes    Birth control/protection: Other-see comments    Comment: VASECTOMY  Other Topics Concern   Not on file  Social History Narrative   Not on file   Social Determinants of Health   Financial Resource Strain: Not on file  Food Insecurity: Not on file  Transportation Needs: Not on file  Physical Activity: Inactive (06/06/2019)   Exercise Vital Sign    Days of Exercise per Week: 0 days    Minutes of Exercise per Session: 0 min  Stress: Not on file  Social Connections: Not on file  Intimate Partner Violence: Not on file    Current Outpatient Medications on File Prior to Visit  Medication Sig Dispense  Refill   amoxicillin (AMOXIL) 500 MG capsule Take 1 capsule (500 mg total) by mouth 3 (three) times daily for 7 days 21 capsule 0   fluconazole (DIFLUCAN) 150 MG tablet Take 1 tablet (150 mg total) by mouth once daily for 2 doses 2 tablet 0   HYDROcodone-ibuprofen (VICOPROFEN) 7.5-200 MG per tablet Take 1 tablet by mouth 2 (two) times daily as needed for severe pain.      levofloxacin (LEVAQUIN) 750 MG tablet Take 1 tablet (750 mg total) by mouth once daily for 7 days 7 tablet 0   pantoprazole (PROTONIX) 40 MG tablet Take 1 tablet by mouth daily.     pantoprazole (PROTONIX) 40 MG tablet Take 1 tablet (40 mg total) by mouth once daily 90 tablet 3   pantoprazole (PROTONIX) 40 MG tablet Take 1 tablet (40 mg total) by mouth once daily 90 tablet 3   valACYclovir (VALTREX) 1000 MG tablet Take 2 tablets by mouth 2 (two) times daily as needed (cold sores).      [DISCONTINUED] triazolam (HALCION) 0.25 MG tablet 2 tabs 1.5 hours  prior dental visit 2 tablet 0   No current facility-administered medications on file prior to visit.    Allergies  Allergen Reactions   Penicillin G Rash    In high doses.  Ok to take amoxicillin      Review of Systems ROS Review of Systems - General ROS: negative for - chills, fatigue, fever, hot flashes, night sweats, weight gain or weight loss Psychological ROS: negative for - anxiety, decreased libido, depression, mood swings, physical abuse or sexual abuse Ophthalmic ROS: negative for - blurry vision, eye pain or loss of vision ENT ROS: negative for - headaches, hearing change, visual changes or vocal changes Allergy and Immunology ROS: negative for - hives, itchy/watery eyes or seasonal allergies Hematological and Lymphatic ROS: negative for - bleeding problems, bruising, swollen lymph nodes or weight loss Endocrine ROS: negative for - galactorrhea, hair pattern changes, hot flashes, malaise/lethargy, mood swings, palpitations, polydipsia/polyuria, skin changes, temperature intolerance or unexpected weight changes Breast ROS: negative for - new or changing breast lumps or nipple discharge Respiratory ROS: negative for - cough or shortness of breath Cardiovascular ROS: negative for - chest pain, irregular heartbeat, palpitations or shortness of breath Gastrointestinal ROS: no abdominal pain, change in bowel habits, or black or bloody stools Genito-Urinary ROS: no dysuria, trouble voiding, or hematuria Musculoskeletal ROS: negative for - joint pain or joint stiffness Neurological ROS: negative for - bowel and bladder control changes Dermatological ROS: negative for rash and skin lesion changes   Objective:   There were no vitals taken for this visit. CONSTITUTIONAL: Well-developed, well-nourished female in no acute distress.  PSYCHIATRIC: Normal mood and affect. Normal behavior. Normal judgment and thought content. [place]: Alert and oriented to person, place, and time.  Normal muscle tone coordination. No cranial nerve deficit noted. HENT:  Normocephalic, atraumatic, External right and left ear normal. Oropharynx is clear and moist EYES: Conjunctivae and EOM are normal. Pupils are equal, round, and reactive to light. No scleral icterus.  NECK: Normal range of motion, supple, no masses.  Normal thyroid.  SKIN: Skin is warm and dry. No rash noted. Not diaphoretic. No erythema. No pallor. CARDIOVASCULAR: Normal heart rate noted, regular rhythm, no murmur. RESPIRATORY: Clear to auscultation bilaterally. Effort and breath sounds normal, no problems with respiration noted. BREASTS: Symmetric in size. No masses, skin changes, nipple drainage, or lymphadenopathy. ABDOMEN: Soft, normal bowel sounds, no distention noted.  No  tenderness, rebound or guarding.  BLADDER: Normal PELVIC:  Bladder {:311640}  Urethra: {:311719}  Vulva: {:311722}  Vagina: {:311643}  Cervix: {:311644}  Uterus: {:311718}  Adnexa: {:311645}  RV: {Blank multiple:19196::"External Exam NormaI","No Rectal Masses","Normal Sphincter tone"}  MUSCULOSKELETAL: Normal range of motion. No tenderness.  No cyanosis, clubbing, or edema.  2+ distal pulses. LYMPHATIC: No Axillary, Supraclavicular, or Inguinal Adenopathy.   Labs: No results found for: "WBC", "HGB", "HCT", "MCV", "PLT"  No results found for: "CREATININE", "BUN", "NA", "K", "CL", "CO2"  No results found for: "ALT", "AST", "GGT", "ALKPHOS", "BILITOT"  Lab Results  Component Value Date   CHOL 177 01/13/2017   HDL 48 01/13/2017   LDLCALC 114 (H) 01/13/2017   TRIG 76 01/13/2017   CHOLHDL 3.7 01/13/2017    Lab Results  Component Value Date   TSH 5.040 (H) 01/22/2017    Lab Results  Component Value Date   HGBA1C 5.6 01/13/2017     Assessment:   1. Encounter for well woman exam with routine gynecological exam   2. Screen for STD (sexually transmitted disease)   3. Subclinical hypothyroidism   4. Overweight (BMI 25.0-29.9)    5. Family history of breast cancer in mother   40. Encounter for screening mammogram for malignant neoplasm of breast   7. Screening cholesterol level   8. Screening for diabetes mellitus (DM)      Plan:  Pap:  UTD Mammogram:  UTD and ordered for next visit. Colon Screening:  Ordered Labs:  Ordered Routine preventative health maintenance measures emphasized: Exercise/Diet/Weight control and Stress Management, Self Breast Exams COVID Vaccination status: Return to [place], [place]

## 2022-10-20 ENCOUNTER — Other Ambulatory Visit (HOSPITAL_COMMUNITY)
Admission: RE | Admit: 2022-10-20 | Discharge: 2022-10-20 | Disposition: A | Discharge status: Home / Self Care | Payer: Managed Care, Other (non HMO) | Source: Ambulatory Visit | Attending: Obstetrics and Gynecology | Admitting: Obstetrics and Gynecology

## 2022-10-20 ENCOUNTER — Ambulatory Visit (INDEPENDENT_AMBULATORY_CARE_PROVIDER_SITE_OTHER): Payer: Managed Care, Other (non HMO) | Admitting: Obstetrics and Gynecology

## 2022-10-20 ENCOUNTER — Encounter: Payer: Self-pay | Admitting: Obstetrics and Gynecology

## 2022-10-20 VITALS — BP 115/79 | HR 93 | Resp 16 | Ht 66.0 in | Wt 178.8 lb

## 2022-10-20 DIAGNOSIS — Z803 Family history of malignant neoplasm of breast: Secondary | ICD-10-CM

## 2022-10-20 DIAGNOSIS — Z113 Encounter for screening for infections with a predominantly sexual mode of transmission: Secondary | ICD-10-CM

## 2022-10-20 DIAGNOSIS — E663 Overweight: Secondary | ICD-10-CM

## 2022-10-20 DIAGNOSIS — Z13220 Encounter for screening for lipoid disorders: Secondary | ICD-10-CM

## 2022-10-20 DIAGNOSIS — Z124 Encounter for screening for malignant neoplasm of cervix: Secondary | ICD-10-CM | POA: Diagnosis present

## 2022-10-20 DIAGNOSIS — Z01419 Encounter for gynecological examination (general) (routine) without abnormal findings: Secondary | ICD-10-CM | POA: Insufficient documentation

## 2022-10-20 DIAGNOSIS — Z1231 Encounter for screening mammogram for malignant neoplasm of breast: Secondary | ICD-10-CM

## 2022-10-20 DIAGNOSIS — Z131 Encounter for screening for diabetes mellitus: Secondary | ICD-10-CM

## 2022-10-20 DIAGNOSIS — E038 Other specified hypothyroidism: Secondary | ICD-10-CM

## 2022-10-26 LAB — CYTOLOGY - PAP: Diagnosis: NEGATIVE

## 2022-11-23 ENCOUNTER — Other Ambulatory Visit: Payer: Self-pay

## 2022-11-23 MED ORDER — PREDNISONE 10 MG PO TABS
ORAL_TABLET | ORAL | 0 refills | Status: DC
  Filled 2022-11-23: qty 21, 6d supply, fill #0

## 2022-11-23 MED ORDER — DOXYCYCLINE HYCLATE 100 MG PO CAPS
100.0000 mg | ORAL_CAPSULE | Freq: Two times a day (BID) | ORAL | 0 refills | Status: DC
  Filled 2022-11-23: qty 20, 10d supply, fill #0

## 2022-12-09 ENCOUNTER — Other Ambulatory Visit: Payer: Self-pay

## 2022-12-09 MED ORDER — PANTOPRAZOLE SODIUM 40 MG PO TBEC
40.0000 mg | DELAYED_RELEASE_TABLET | Freq: Every day | ORAL | 3 refills | Status: AC
  Filled 2022-12-09: qty 90, 90d supply, fill #0
  Filled 2023-03-15: qty 90, 90d supply, fill #1
  Filled 2023-06-17: qty 90, 90d supply, fill #2
  Filled 2023-09-06: qty 90, 90d supply, fill #3

## 2023-03-15 ENCOUNTER — Other Ambulatory Visit: Payer: Self-pay

## 2023-04-01 ENCOUNTER — Ambulatory Visit
Admission: RE | Admit: 2023-04-01 | Discharge: 2023-04-01 | Disposition: A | Discharge status: Home / Self Care | Payer: Managed Care, Other (non HMO) | Source: Ambulatory Visit | Attending: Obstetrics and Gynecology | Admitting: Obstetrics and Gynecology

## 2023-04-01 DIAGNOSIS — Z803 Family history of malignant neoplasm of breast: Secondary | ICD-10-CM | POA: Diagnosis present

## 2023-04-01 DIAGNOSIS — Z01419 Encounter for gynecological examination (general) (routine) without abnormal findings: Secondary | ICD-10-CM | POA: Insufficient documentation

## 2023-04-01 DIAGNOSIS — Z1231 Encounter for screening mammogram for malignant neoplasm of breast: Secondary | ICD-10-CM | POA: Diagnosis not present

## 2023-05-08 NOTE — Discharge Instructions (Addendum)
Instructions after Total Knee Replacement   James P. Angie Fava., M.D.    Dept. of Orthopaedics & Sports Medicine [facility] [address] [place], [place]  [address]  Phone: [phone]   Fax: [phone]       [email or web address]       DIET: Drink plenty of non-alcoholic fluids. Resume your normal diet. Include foods high in fiber.  ACTIVITY:  You may use crutches or a walker with weight-bearing as tolerated, unless instructed otherwise. You may be weaned off of the walker or crutches by your Physical Therapist.  Do NOT place pillows under the knee. Anything placed under the knee could limit your ability to straighten the knee.   Continue doing gentle exercises. Exercising will reduce the pain and swelling, increase motion, and prevent muscle weakness.   Please continue to use the TED compression stockings for 6 weeks. You may remove the stockings at night, but should reapply them in the morning. Do not drive or operate any equipment until instructed.  WOUND CARE:  Continue to use the PolarCare or ice packs periodically to reduce pain and swelling. You may bathe or shower after the staples are removed at the first office visit following surgery. The Aquacel bandage remains in place for 7 days postoperatively.  It can be changed out after this point to a honeycomb bandage.  At home PT can help with this.  MEDICATIONS: You may resume your regular medications. Please take the pain medication as prescribed on the medication. Do not take pain medication on an empty stomach. You have been given a prescription for a blood thinner -aspirin 81 mg.  Please take this once in the morning and once at night.  Take this medication and wear your TED hose in conjunction to prevent DVT formation. Do not drive or drink alcoholic beverages when taking pain medications.  CALL THE OFFICE FOR: Temperature above 101 degrees Excessive bleeding or drainage on the dressing. Excessive  swelling, coldness, or paleness of the toes. Persistent nausea and vomiting.  FOLLOW-UP:  You should have an appointment to return to the office in 10-14 days after surgery. Arrangements have been made for continuation of Physical Therapy (either home therapy or outpatient therapy).     [facility] Department Directory         [email or web address]       [email or web address]          Cardiology  Appointments: [place] - [phone]  Endocrinology  Appointments: [place] [phone] [place] - [phone]  Gastroenterology  Appointments: [place] [phone] [place] - [phone]        General Surgery   Appointments: [facility]  Internal Medicine/Family Medicine  Appointments: [facility] [place] - [phone] [place] - [phone]  Metabolic and Weigh Loss Surgery  Appointments: [facility]        Neurology  Appointments: [place] [phone] [place] - [phone]  Neurosurgery  Appointments: [place]  Obstetrics & Gynecology  Appointments: [place] [phone] [place] - [phone]        Pediatrics  Appointments: [place] [phone] [place] - [phone]  Physiatry  Appointments: [place] [phone]  Physical Therapy  Appointments: [place] - [phone]        Podiatry  Appointments: [place] [phone] [place] - [phone]  Pulmonology  Appointments: [place]  Rheumatology  Appointments: [place] [phone]        [place] Location: [facility]  [address] [place], [place]  [address]  [place] Location: [facility] [address] [place], [place]  [address]  [place] Location: [facility] [address] [place], [place]  [address]

## 2023-05-21 ENCOUNTER — Other Ambulatory Visit: Payer: Self-pay | Admitting: Orthopedic Surgery

## 2023-05-25 ENCOUNTER — Other Ambulatory Visit: Payer: Self-pay

## 2023-05-25 ENCOUNTER — Encounter
Admission: RE | Admit: 2023-05-25 | Discharge: 2023-05-25 | Disposition: A | Discharge status: Home / Self Care | Payer: Managed Care, Other (non HMO) | Source: Ambulatory Visit | Attending: Orthopedic Surgery | Admitting: Orthopedic Surgery

## 2023-05-25 VITALS — HR 99 | Resp 16 | Ht 66.0 in | Wt 178.6 lb

## 2023-05-25 DIAGNOSIS — M1711 Unilateral primary osteoarthritis, right knee: Secondary | ICD-10-CM | POA: Insufficient documentation

## 2023-05-25 DIAGNOSIS — R8290 Unspecified abnormal findings in urine: Secondary | ICD-10-CM | POA: Diagnosis not present

## 2023-05-25 DIAGNOSIS — R8281 Pyuria: Secondary | ICD-10-CM | POA: Diagnosis not present

## 2023-05-25 DIAGNOSIS — Z01812 Encounter for preprocedural laboratory examination: Secondary | ICD-10-CM

## 2023-05-25 DIAGNOSIS — Z880 Allergy status to penicillin: Secondary | ICD-10-CM | POA: Diagnosis not present

## 2023-05-25 DIAGNOSIS — Z01818 Encounter for other preprocedural examination: Secondary | ICD-10-CM | POA: Insufficient documentation

## 2023-05-25 DIAGNOSIS — Z01810 Encounter for preprocedural cardiovascular examination: Secondary | ICD-10-CM

## 2023-05-25 HISTORY — DX: Unilateral primary osteoarthritis, left knee: M17.12

## 2023-05-25 HISTORY — DX: Unilateral primary osteoarthritis, right knee: M17.11

## 2023-05-25 HISTORY — DX: Cardiac murmur, unspecified: R01.1

## 2023-05-25 HISTORY — DX: Hypothyroidism, unspecified: E03.9

## 2023-05-25 LAB — CBC
HCT: 39.5 % (ref 36.0–46.0)
Hemoglobin: 13.3 g/dL (ref 12.0–15.0)
MCH: 31.1 pg (ref 26.0–34.0)
MCHC: 33.7 g/dL (ref 30.0–36.0)
MCV: 92.3 fL (ref 80.0–100.0)
Platelets: 272 10*3/uL (ref 150–400)
RBC: 4.28 MIL/uL (ref 3.87–5.11)
RDW: 12.1 % (ref 11.5–15.5)
WBC: 9 10*3/uL (ref 4.0–10.5)
nRBC: 0 % (ref 0.0–0.2)

## 2023-05-25 LAB — URINALYSIS, ROUTINE W REFLEX MICROSCOPIC
Bilirubin Urine: NEGATIVE
Glucose, UA: NEGATIVE mg/dL
Hgb urine dipstick: NEGATIVE
Ketones, ur: NEGATIVE mg/dL
Nitrite: NEGATIVE
Protein, ur: NEGATIVE mg/dL
Specific Gravity, Urine: 1.008 (ref 1.005–1.030)
pH: 5 (ref 5.0–8.0)

## 2023-05-25 LAB — COMPREHENSIVE METABOLIC PANEL
ALT: 24 U/L (ref 0–44)
AST: 23 U/L (ref 15–41)
Albumin: 4.2 g/dL (ref 3.5–5.0)
Alkaline Phosphatase: 82 U/L (ref 38–126)
Anion gap: 10 (ref 5–15)
BUN: 19 mg/dL (ref 6–20)
CO2: 24 mmol/L (ref 22–32)
Calcium: 9.4 mg/dL (ref 8.9–10.3)
Chloride: 104 mmol/L (ref 98–111)
Creatinine, Ser: 0.73 mg/dL (ref 0.44–1.00)
GFR, Estimated: >60 mL/min (ref >60)
Glucose, Bld: 98 mg/dL (ref 70–99)
Potassium: 3.8 mmol/L (ref 3.5–5.1)
Sodium: 138 mmol/L (ref 135–145)
Total Bilirubin: 0.8 mg/dL (ref 0.3–1.2)
Total Protein: 7.7 g/dL (ref 6.5–8.1)

## 2023-05-25 LAB — SURGICAL PCR SCREEN
MRSA, PCR: NEGATIVE
Staphylococcus aureus: POSITIVE — AB

## 2023-05-25 LAB — SEDIMENTATION RATE: Sed Rate: 21 mm/h (ref 0–30)

## 2023-05-25 LAB — C-REACTIVE PROTEIN: CRP: 0.7 mg/dL (ref <1.0)

## 2023-05-25 LAB — TYPE AND SCREEN
ABO/RH(D): O POS
Antibody Screen: NEGATIVE

## 2023-05-25 NOTE — Patient Instructions (Addendum)
Your procedure is scheduled on: 06/02/23 - Wednesday Report to the Registration Desk on the 1st floor of the [facility]. To find out your arrival time, please call [phone] between 1PM - 3PM on: 06/01/23 - Tuesday If your arrival time is 6:00 am, do not arrive before that time as the [facility] entrance doors do not open until 6:00 am.  REMEMBER: Instructions that are not followed completely may result in serious medical risk, up to and including death; or upon the discretion of your surgeon and anesthesiologist your surgery may need to be rescheduled.  Do not eat food after midnight the night before surgery.  No gum chewing or hard candies.  You may however, drink CLEAR liquids up to 2 hours before you are scheduled to arrive for your surgery. Do not drink anything within 2 hours of your scheduled arrival time.  Clear liquids include: - water  - apple juice without pulp - gatorade (not RED colors) - black coffee or tea (Do NOT add milk or creamers to the coffee or tea) Do NOT drink anything that is not on this list.  In addition, your doctor has ordered for you to drink the provided:  Ensure Pre-Surgery Clear Carbohydrate Drink  Drinking this carbohydrate drink up to two hours before surgery helps to reduce insulin resistance and improve patient outcomes. Please complete drinking 2 hours before scheduled arrival time.  One week prior to surgery beginning 05/26/23: Stop Anti-inflammatories (NSAIDS) such as meloxicam (MOBIC) ,Advil, Aleve, Ibuprofen, Motrin, Naproxen, Naprosyn and Aspirin based products such as Excedrin, Goody's Powder, BC Powder. You may however, continue to take Tylenol if needed for pain up until the day of surgery.  Stop ANY OVER THE COUNTER supplements until after surgery.   TAKE ONLY THESE MEDICATIONS THE MORNING OF SURGERY WITH A SIP OF WATER:  pantoprazole (PROTONIX) - take one the night before and one on the morning of surgery - helps to prevent  nausea after surgery.) levothyroxine (SYNTHROID)   No Alcohol for 24 hours before or after surgery.  No Smoking including e-cigarettes for 24 hours before surgery.  No chewable tobacco products for at least 6 hours before surgery.  No nicotine patches on the day of surgery.  Do not use any "recreational" drugs for at least a week (preferably 2 weeks) before your surgery.  Please be advised that the combination of cocaine and anesthesia may have negative outcomes, up to and including death. If you test positive for cocaine, your surgery will be cancelled.  On the morning of surgery brush your teeth with toothpaste and water, you may rinse your mouth with mouthwash if you wish. Do not swallow any toothpaste or mouthwash.  Use CHG Soap or wipes as directed on instruction sheet.  Do not wear jewelry, make-up, hairpins, clips or nail polish.  For welded (permanent) jewelry: bracelets, anklets, waist bands, etc.  Please have this removed prior to surgery.  If it is not removed, there is a chance that hospital personnel will need to cut it off on the day of surgery.  Do not wear lotions, powders, or perfumes.   Contact lenses, hearing aids and dentures may not be worn into surgery.  Do not bring valuables to the hospital. [facility] is not responsible for any missing/lost belongings or valuables.   Notify your doctor if there is any change in your medical condition (cold, fever, infection).  Wear comfortable clothing (specific to your surgery type) to the hospital.  After surgery, you can  help prevent lung complications by doing breathing exercises.  Take deep breaths and cough every 1-2 hours. Your doctor may order a device called an Incentive Spirometer to help you take deep breaths. When coughing or sneezing, hold a pillow firmly against your incision with both hands. This is called "splinting." Doing this helps protect your incision. It also decreases belly discomfort.  If you are  being admitted to the hospital overnight, leave your suitcase in the car. After surgery it may be brought to your room.  In case of increased patient census, it may be necessary for you, the patient, to continue your postoperative care in the Same Day Surgery department.  If you are being discharged the day of surgery, you will not be allowed to drive home. You will need a responsible individual to drive you home and stay with you for 24 hours after surgery.   If you are taking public transportation, you will need to have a responsible individual with you.  Please call the Pre-admissions Testing Dept. at [phone] if you have any questions about these instructions.  Surgery Visitation Policy:  Patients having surgery or a procedure may have two visitors.  Children under the age of 48 must have an adult with them who is not the patient.  Inpatient Visitation:    Visiting hours are 7 a.m. to 8 p.m. Up to four visitors are allowed at one time in a patient room. The visitors may rotate out with other people during the day.  One visitor age 58 or older may stay with the patient overnight and must be in the room by 8 p.m.    Pre-operative 5 CHG Bath Instructions   You can play a key role in reducing the risk of infection after surgery. Your skin needs to be as free of germs as possible. You can reduce the number of germs on your skin by washing with CHG (chlorhexidine gluconate) soap before surgery. CHG is an antiseptic soap that kills germs and continues to kill germs even after washing.   DO NOT use if you have an allergy to chlorhexidine/CHG or antibacterial soaps. If your skin becomes reddened or irritated, stop using the CHG and notify one of our RNs at [phone].   Please shower with the CHG soap starting 4 days before surgery using the following schedule: 09/14 - 09/18.    Please keep in mind the following:  DO NOT shave, including legs and underarms, starting the day of  your first shower.   You may shave your face at any point before/day of surgery.  Place clean sheets on your bed the day you start using CHG soap. Use a clean washcloth (not used since being washed) for each shower. DO NOT sleep with pets once you start using the CHG.   CHG Shower Instructions:  If you choose to wash your hair and private area, wash first with your normal shampoo/soap.  After you use shampoo/soap, rinse your hair and body thoroughly to remove shampoo/soap residue.  Turn the water OFF and apply about 3 tablespoons (45 ml) of CHG soap to a CLEAN washcloth.  Apply CHG soap ONLY FROM YOUR NECK DOWN TO YOUR TOES (washing for 3-5 minutes)  DO NOT use CHG soap on face, private areas, open wounds, or sores.  Pay special attention to the area where your surgery is being performed.  If you are having back surgery, having someone wash your back for you may be helpful. Wait 2 minutes after CHG  soap is applied, then you may rinse off the CHG soap.  Pat dry with a clean towel  Put on clean clothes/pajamas   If you choose to wear lotion, please use ONLY the CHG-compatible lotions on the back of this paper.     Additional instructions for the day of surgery: DO NOT APPLY any lotions, deodorants, cologne, or perfumes.   Put on clean/comfortable clothes.  Brush your teeth.  Ask your nurse before applying any prescription medications to the skin.      CHG Compatible Lotions   Aveeno Moisturizing lotion  Cetaphil Moisturizing Cream  Cetaphil Moisturizing Lotion  Clairol Herbal Essence Moisturizing Lotion, Dry Skin  Clairol Herbal Essence Moisturizing Lotion, Extra Dry Skin  Clairol Herbal Essence Moisturizing Lotion, Normal Skin  Curel Age Defying Therapeutic Moisturizing Lotion with Alpha Hydroxy  Curel Extreme Care Body Lotion  Curel Soothing Hands Moisturizing Hand Lotion  Curel Therapeutic Moisturizing Cream, Fragrance-Free  Curel Therapeutic Moisturizing Lotion,  Fragrance-Free  Curel Therapeutic Moisturizing Lotion, Original Formula  Eucerin Daily Replenishing Lotion  Eucerin Dry Skin Therapy Plus Alpha Hydroxy Crme  Eucerin Dry Skin Therapy Plus Alpha Hydroxy Lotion  Eucerin Original Crme  Eucerin Original Lotion  Eucerin Plus Crme Eucerin Plus Lotion  Eucerin TriLipid Replenishing Lotion  Keri Anti-Bacterial Hand Lotion  Keri Deep Conditioning Original Lotion Dry Skin Formula Softly Scented  Keri Deep Conditioning Original Lotion, Fragrance Free Sensitive Skin Formula  Keri Lotion Fast Absorbing Fragrance Free Sensitive Skin Formula  Keri Lotion Fast Absorbing Softly Scented Dry Skin Formula  Keri Original Lotion  Keri Skin Renewal Lotion Keri Silky Smooth Lotion  Keri Silky Smooth Sensitive Skin Lotion  Nivea Body Creamy Conditioning Oil  Nivea Body Extra Enriched Lotion  Nivea Body Original Lotion  Nivea Body Sheer Moisturizing Lotion Nivea Crme  Nivea Skin Firming Lotion  NutraDerm 30 Skin Lotion  NutraDerm Skin Lotion  NutraDerm Therapeutic Skin Cream  NutraDerm Therapeutic Skin Lotion  ProShield Protective Hand Cream  Provon moisturizing lotion    How to Use an Incentive Spirometer  An incentive spirometer is a tool that measures how well you are filling your lungs with each breath. Learning to take long, deep breaths using this tool can help you keep your lungs clear and active. This may help to reverse or lessen your chance of developing breathing (pulmonary) problems, especially infection. You may be asked to use a spirometer: After a surgery. If you have a lung problem or a history of smoking. After a long period of time when you have been unable to move or be active. If the spirometer includes an indicator to show the highest number that you have reached, your health care provider or respiratory therapist will help you set a goal. Keep a log of your progress as told by your health care provider. What are the  risks? Breathing too quickly may cause dizziness or cause you to pass out. Take your time so you do not get dizzy or light-headed. If you are in pain, you may need to take pain medicine before doing incentive spirometry. It is harder to take a deep breath if you are having pain. How to use your incentive spirometer  Sit up on the edge of your bed or on a chair. Hold the incentive spirometer so that it is in an upright position. Before you use the spirometer, breathe out normally. Place the mouthpiece in your mouth. Make sure your lips are closed tightly around it. Breathe in slowly and as deeply  as you can through your mouth, causing the piston or the ball to rise toward the top of the chamber. Hold your breath for 3-5 seconds, or for as long as possible. If the spirometer includes a coach indicator, use this to guide you in breathing. Slow down your breathing if the indicator goes above the marked areas. Remove the mouthpiece from your mouth and breathe out normally. The piston or ball will return to the bottom of the chamber. Rest for a few seconds, then repeat the steps 10 or more times. Take your time and take a few normal breaths between deep breaths so that you do not get dizzy or light-headed. Do this every 1-2 hours when you are awake. If the spirometer includes a goal marker to show the highest number you have reached (best effort), use this as a goal to work toward during each repetition. After each set of 10 deep breaths, cough a few times. This will help to make sure that your lungs are clear. If you have an incision on your chest or abdomen from surgery, place a pillow or a rolled-up towel firmly against the incision when you cough. This can help to reduce pain while taking deep breaths and coughing. General tips When you are able to get out of bed: Walk around often. Continue to take deep breaths and cough in order to clear your lungs. Keep using the incentive spirometer until  your health care provider says it is okay to stop using it. If you have been in the hospital, you may be told to keep using the spirometer at home. Contact a health care provider if: You are having difficulty using the spirometer. You have trouble using the spirometer as often as instructed. Your pain medicine is not giving enough relief for you to use the spirometer as told. You have a fever. Get help right away if: You develop shortness of breath. You develop a cough with bloody mucus from the lungs. You have fluid or blood coming from an incision site after you cough. Summary An incentive spirometer is a tool that can help you learn to take long, deep breaths to keep your lungs clear and active. You may be asked to use a spirometer after a surgery, if you have a lung problem or a history of smoking, or if you have been inactive for a long period of time. Use your incentive spirometer as instructed every 1-2 hours while you are awake. If you have an incision on your chest or abdomen, place a pillow or a rolled-up towel firmly against your incision when you cough. This will help to reduce pain. Get help right away if you have shortness of breath, you cough up bloody mucus, or blood comes from your incision when you cough. This information is not intended to replace advice given to you by your health care provider. Make sure you discuss any questions you have with your health care provider. Document Revised: 11/20/2019 Document Reviewed: 11/20/2019 Elsevier Patient Education  2023 [employer].  POLAR CARE INFORMATION  MassAdvertisement.it  How to use [facility] Therapy System?  YouTube   ShippingScam.co.uk  OPERATING INSTRUCTIONS  Start the product With dry hands, connect the transformer to the electrical connection located on the top of the cooler. Next, plug the transformer into an appropriate electrical outlet. The unit will automatically start  running at this point.  To stop the pump, disconnect electrical power.  Unplug to stop the product when not in  use. Unplugging the Polar Care unit turns it off. Always unplug immediately after use. Never leave it plugged in while unattended. Remove pad.    FIRST ADD WATER TO FILL LINE, THEN ICE---Replace ice when existing ice is almost melted  1 Discuss Treatment with your Licensed Health Care Practitioner and Use Only as Prescribed 2 Apply Insulation Barrier & Cold Therapy Pad 3 Check for Moisture 4 Inspect Skin Regularly  Tips and Trouble Shooting Usage Tips 1. Use cubed or chunked ice for optimal performance. 2. It is recommended to drain the Pad between uses. To drain the pad, hold the Pad upright with the hose pointed toward the ground. Depress the black plunger and allow water to drain out. 3. You may disconnect the Pad from the unit without removing the pad from the affected area by depressing the silver tabs on the hose coupling and gently pulling the hoses apart. The Pad and unit will seal itself and will not leak. Note: Some dripping during release is normal. 4. DO NOT RUN PUMP WITHOUT WATER! The pump in this unit is designed to run with water. Running the unit without water will cause permanent damage to the pump. 5. Unplug unit before removing lid.  TROUBLESHOOTING GUIDE Pump not running, Water not flowing to the pad, Pad is not getting cold 1. Make sure the transformer is plugged into the wall outlet. 2. Confirm that the ice and water are filled to the indicated levels. 3. Make sure there are no kinks in the pad. 4. Gently pull on the blue tube to make sure the tube/pad junction is straight. 5. Remove the pad from the treatment site and ll it while the pad is lying at; then reapply. 6. Confirm that the pad couplings are securely attached to the unit. Listen for the double clicks (Figure 1) to confirm the pad couplings are securely attached.  Leaks    Note: Some condensation  on the lines, controller, and pads is unavoidable, especially in warmer climates. 1. If using a [employer] Polar Care Cold Therapy unit with a detachable Cold Therapy Pad, and a leak exists (other than condensation on the lines) disconnect the pad couplings. Make sure the silver tabs on the couplings are depressed before reconnecting the pad to the pump hose; then confirm both sides of the coupling are properly clicked in. 2. If the coupling continues to leak or a leak is detected in the pad itself, stop using it and call [employer] Customer Care at [phone].  Cleaning After use, empty and dry the unit with a soft cloth. Warm water and mild detergent may be used occasionally to clean the pump and tubes.  WARNING: The Polar Care Cube can be cold enough to cause serious injury, including full skin necrosis. Follow these Operating Instructions, and carefully read the Product Insert (see pouch on side of unit) and the Cold Therapy Pad Fitting Instructions (provided with each Cold Therapy Pad) prior to use.

## 2023-05-26 LAB — URINE CULTURE: Culture: <10000 — AB

## 2023-05-27 LAB — IGE: IgE (Immunoglobulin E), Serum: 86 [IU]/mL (ref 6–495)

## 2023-06-02 ENCOUNTER — Observation Stay
Admission: RE | Admit: 2023-06-02 | Discharge: 2023-06-03 | Disposition: A | Discharge status: Home / Self Care | Payer: Managed Care, Other (non HMO) | Attending: Orthopedic Surgery | Admitting: Orthopedic Surgery

## 2023-06-02 ENCOUNTER — Encounter: Payer: Self-pay | Admitting: Orthopedic Surgery

## 2023-06-02 ENCOUNTER — Other Ambulatory Visit: Payer: Self-pay

## 2023-06-02 ENCOUNTER — Ambulatory Visit: Payer: Managed Care, Other (non HMO) | Admitting: Urgent Care

## 2023-06-02 ENCOUNTER — Encounter
Admission: RE | Disposition: A | Discharge status: Home / Self Care | Payer: Self-pay | Source: Home / Self Care | Attending: Orthopedic Surgery

## 2023-06-02 ENCOUNTER — Observation Stay: Payer: Managed Care, Other (non HMO)

## 2023-06-02 ENCOUNTER — Ambulatory Visit: Payer: Managed Care, Other (non HMO) | Admitting: Certified Registered"

## 2023-06-02 DIAGNOSIS — K219 Gastro-esophageal reflux disease without esophagitis: Secondary | ICD-10-CM | POA: Insufficient documentation

## 2023-06-02 DIAGNOSIS — G8929 Other chronic pain: Secondary | ICD-10-CM | POA: Insufficient documentation

## 2023-06-02 DIAGNOSIS — M1711 Unilateral primary osteoarthritis, right knee: Secondary | ICD-10-CM | POA: Diagnosis present

## 2023-06-02 DIAGNOSIS — R519 Headache, unspecified: Secondary | ICD-10-CM | POA: Insufficient documentation

## 2023-06-02 DIAGNOSIS — Z880 Allergy status to penicillin: Secondary | ICD-10-CM

## 2023-06-02 DIAGNOSIS — Z96659 Presence of unspecified artificial knee joint: Secondary | ICD-10-CM

## 2023-06-02 DIAGNOSIS — E049 Nontoxic goiter, unspecified: Secondary | ICD-10-CM | POA: Insufficient documentation

## 2023-06-02 HISTORY — PX: KNEE ARTHROPLASTY: SHX992

## 2023-06-02 SURGERY — ARTHROPLASTY, KNEE, TOTAL, USING IMAGELESS COMPUTER-ASSISTED NAVIGATION
Anesthesia: Spinal | Site: Knee | Laterality: Right

## 2023-06-02 MED ORDER — ACETAMINOPHEN 10 MG/ML IV SOLN: 1000.0000 mg | Freq: Once | INTRAVENOUS | Status: DC | PRN

## 2023-06-02 MED ORDER — PHENYLEPHRINE 80 MCG/ML (10ML) SYRINGE FOR IV PUSH (FOR BLOOD PRESSURE SUPPORT)
PREFILLED_SYRINGE | INTRAVENOUS | Status: AC
  Filled 2023-06-02: qty 10

## 2023-06-02 MED ORDER — VALACYCLOVIR HCL 500 MG PO TABS: 2000.0000 mg | ORAL_TABLET | Freq: Two times a day (BID) | ORAL | Status: DC | PRN

## 2023-06-02 MED ORDER — PROPOFOL 10 MG/ML IV BOLUS
INTRAVENOUS | Status: AC
  Filled 2023-06-02: qty 20

## 2023-06-02 MED ORDER — CHLORHEXIDINE GLUCONATE 0.12 % MT SOLN
15.0000 mL | Freq: Once | OROMUCOSAL | Status: AC
  Administered 2023-06-02: 15 mL via OROMUCOSAL

## 2023-06-02 MED ORDER — HYDROMORPHONE HCL 1 MG/ML IJ SOLN: 0.5000 mg | INTRAMUSCULAR | Status: DC | PRN

## 2023-06-02 MED ORDER — SODIUM CHLORIDE FLUSH 0.9 % IV SOLN
INTRAVENOUS | Status: AC
  Filled 2023-06-02: qty 40

## 2023-06-02 MED ORDER — MELOXICAM 7.5 MG PO TABS
7.5000 mg | ORAL_TABLET | Freq: Two times a day (BID) | ORAL | Status: DC
  Administered 2023-06-02 – 2023-06-03 (×2): 7.5 mg via ORAL

## 2023-06-02 MED ORDER — FENTANYL CITRATE (PF) 100 MCG/2ML IJ SOLN
INTRAMUSCULAR | Status: AC
  Filled 2023-06-02: qty 2

## 2023-06-02 MED ORDER — CYCLOBENZAPRINE HCL 10 MG PO TABS
10.0000 mg | ORAL_TABLET | Freq: Two times a day (BID) | ORAL | Status: DC | PRN
  Administered 2023-06-02: 10 mg via ORAL
  Filled 2023-06-02 (×2): qty 1

## 2023-06-02 MED ORDER — ORAL CARE MOUTH RINSE: 15.0000 mL | Freq: Once | OROMUCOSAL | Status: AC

## 2023-06-02 MED ORDER — PANTOPRAZOLE SODIUM 40 MG PO TBEC
40.0000 mg | DELAYED_RELEASE_TABLET | Freq: Two times a day (BID) | ORAL | Status: DC
  Administered 2023-06-02 – 2023-06-03 (×2): 40 mg via ORAL

## 2023-06-02 MED ORDER — SODIUM CHLORIDE (PF) 0.9 % IJ SOLN
INTRAMUSCULAR | Status: DC | PRN
  Administered 2023-06-02: 120 mL via INTRAMUSCULAR

## 2023-06-02 MED ORDER — SODIUM CHLORIDE 0.9 % IV SOLN: INTRAVENOUS | Status: DC

## 2023-06-02 MED ORDER — TRANEXAMIC ACID-NACL 1000-0.7 MG/100ML-% IV SOLN
INTRAVENOUS | Status: AC
  Filled 2023-06-02: qty 100

## 2023-06-02 MED ORDER — TRAMADOL HCL 50 MG PO TABS
ORAL_TABLET | ORAL | Status: AC
  Filled 2023-06-02: qty 2

## 2023-06-02 MED ORDER — METOCLOPRAMIDE HCL 10 MG PO TABS
10.0000 mg | ORAL_TABLET | Freq: Three times a day (TID) | ORAL | Status: DC
  Administered 2023-06-02 – 2023-06-03 (×3): 10 mg via ORAL

## 2023-06-02 MED ORDER — BUPIVACAINE HCL (PF) 0.5 % IJ SOLN
INTRAMUSCULAR | Status: AC
  Filled 2023-06-02: qty 10

## 2023-06-02 MED ORDER — GLYCOPYRROLATE 0.2 MG/ML IJ SOLN
INTRAMUSCULAR | Status: DC | PRN
  Administered 2023-06-02: .2 mg via INTRAVENOUS

## 2023-06-02 MED ORDER — GLYCOPYRROLATE 0.2 MG/ML IJ SOLN
INTRAMUSCULAR | Status: AC
  Filled 2023-06-02: qty 1

## 2023-06-02 MED ORDER — SENNOSIDES-DOCUSATE SODIUM 8.6-50 MG PO TABS
ORAL_TABLET | ORAL | Status: AC
  Filled 2023-06-02: qty 1

## 2023-06-02 MED ORDER — PROPOFOL 10 MG/ML IV BOLUS
INTRAVENOUS | Status: DC | PRN
Start: 2023-06-02 — End: 2023-06-02
  Administered 2023-06-02: 20 mg via INTRAVENOUS

## 2023-06-02 MED ORDER — ACETAMINOPHEN 10 MG/ML IV SOLN
INTRAVENOUS | Status: AC
  Filled 2023-06-02: qty 100

## 2023-06-02 MED ORDER — ACETAMINOPHEN 10 MG/ML IV SOLN
INTRAVENOUS | Status: DC | PRN
  Administered 2023-06-02: 1000 mg via INTRAVENOUS

## 2023-06-02 MED ORDER — ENSURE PRE-SURGERY PO LIQD
296.0000 mL | Freq: Once | ORAL | Status: AC
  Administered 2023-06-02: 296 mL via ORAL
  Filled 2023-06-02: qty 296

## 2023-06-02 MED ORDER — FENTANYL CITRATE (PF) 100 MCG/2ML IJ SOLN: 25.0000 ug | INTRAMUSCULAR | Status: DC | PRN

## 2023-06-02 MED ORDER — METOCLOPRAMIDE HCL 10 MG PO TABS
ORAL_TABLET | ORAL | Status: AC
  Filled 2023-06-02: qty 1

## 2023-06-02 MED ORDER — ASPIRIN 81 MG PO CHEW
81.0000 mg | CHEWABLE_TABLET | Freq: Two times a day (BID) | ORAL | Status: DC
  Administered 2023-06-02 – 2023-06-03 (×2): 81 mg via ORAL

## 2023-06-02 MED ORDER — CEFAZOLIN SODIUM-DEXTROSE 2-4 GM/100ML-% IV SOLN
2.0000 g | Freq: Three times a day (TID) | INTRAVENOUS | Status: AC
  Administered 2023-06-02 – 2023-06-03 (×2): 2 g via INTRAVENOUS

## 2023-06-02 MED ORDER — FERROUS SULFATE 325 (65 FE) MG PO TABS
ORAL_TABLET | ORAL | Status: AC
  Filled 2023-06-02: qty 1

## 2023-06-02 MED ORDER — CELECOXIB 200 MG PO CAPS
400.0000 mg | ORAL_CAPSULE | Freq: Once | ORAL | Status: AC
  Administered 2023-06-02: 400 mg via ORAL

## 2023-06-02 MED ORDER — TRAMADOL HCL 50 MG PO TABS
50.0000 mg | ORAL_TABLET | ORAL | Status: DC | PRN
  Administered 2023-06-02 – 2023-06-03 (×2): 100 mg via ORAL

## 2023-06-02 MED ORDER — OXYCODONE HCL 5 MG PO TABS
5.0000 mg | ORAL_TABLET | Freq: Once | ORAL | Status: AC | PRN
  Administered 2023-06-02: 5 mg via ORAL

## 2023-06-02 MED ORDER — PROPOFOL 1000 MG/100ML IV EMUL
INTRAVENOUS | Status: AC
  Filled 2023-06-02: qty 100

## 2023-06-02 MED ORDER — ACETAMINOPHEN 10 MG/ML IV SOLN
1000.0000 mg | Freq: Four times a day (QID) | INTRAVENOUS | Status: DC
  Administered 2023-06-02 – 2023-06-03 (×3): 1000 mg via INTRAVENOUS

## 2023-06-02 MED ORDER — ONDANSETRON HCL 4 MG/2ML IJ SOLN: 4.0000 mg | Freq: Four times a day (QID) | INTRAMUSCULAR | Status: DC | PRN

## 2023-06-02 MED ORDER — EPHEDRINE 5 MG/ML INJ
INTRAVENOUS | Status: AC
  Filled 2023-06-02: qty 5

## 2023-06-02 MED ORDER — MENTHOL 3 MG MT LOZG: 1.0000 | LOZENGE | OROMUCOSAL | Status: DC | PRN

## 2023-06-02 MED ORDER — CELECOXIB 200 MG PO CAPS
ORAL_CAPSULE | ORAL | Status: AC
  Filled 2023-06-02: qty 2

## 2023-06-02 MED ORDER — ALUM & MAG HYDROXIDE-SIMETH 200-200-20 MG/5ML PO SUSP: 30.0000 mL | ORAL | Status: DC | PRN

## 2023-06-02 MED ORDER — PROPOFOL 500 MG/50ML IV EMUL
INTRAVENOUS | Status: DC | PRN
  Administered 2023-06-02: 50 ug/kg/min via INTRAVENOUS

## 2023-06-02 MED ORDER — OXYCODONE HCL 5 MG/5ML PO SOLN: 5.0000 mg | Freq: Once | ORAL | Status: AC | PRN

## 2023-06-02 MED ORDER — CEFAZOLIN SODIUM-DEXTROSE 2-4 GM/100ML-% IV SOLN
2.0000 g | INTRAVENOUS | Status: AC
  Administered 2023-06-02: 2 g via INTRAVENOUS

## 2023-06-02 MED ORDER — MIDAZOLAM HCL 5 MG/5ML IJ SOLN
INTRAMUSCULAR | Status: DC | PRN
  Administered 2023-06-02: 2 mg via INTRAVENOUS

## 2023-06-02 MED ORDER — MELOXICAM 7.5 MG PO TABS
ORAL_TABLET | ORAL | Status: AC
  Filled 2023-06-02: qty 1

## 2023-06-02 MED ORDER — BUPIVACAINE LIPOSOME 1.3 % IJ SUSP
INTRAMUSCULAR | Status: AC
  Filled 2023-06-02: qty 20

## 2023-06-02 MED ORDER — GABAPENTIN 300 MG PO CAPS
300.0000 mg | ORAL_CAPSULE | Freq: Once | ORAL | Status: AC
  Administered 2023-06-02: 300 mg via ORAL

## 2023-06-02 MED ORDER — OXYCODONE HCL 5 MG PO TABS
ORAL_TABLET | ORAL | Status: AC
  Filled 2023-06-02: qty 1

## 2023-06-02 MED ORDER — CEFAZOLIN SODIUM-DEXTROSE 2-4 GM/100ML-% IV SOLN
INTRAVENOUS | Status: AC
  Filled 2023-06-02: qty 100

## 2023-06-02 MED ORDER — FERROUS SULFATE 325 (65 FE) MG PO TABS
325.0000 mg | ORAL_TABLET | Freq: Two times a day (BID) | ORAL | Status: DC
  Administered 2023-06-02 – 2023-06-03 (×2): 325 mg via ORAL

## 2023-06-02 MED ORDER — MAGNESIUM HYDROXIDE 400 MG/5ML PO SUSP
30.0000 mL | Freq: Every day | ORAL | Status: DC
  Administered 2023-06-02 – 2023-06-03 (×2): 30 mL via ORAL
  Filled 2023-06-02 (×2): qty 30

## 2023-06-02 MED ORDER — TRANEXAMIC ACID-NACL 1000-0.7 MG/100ML-% IV SOLN
1000.0000 mg | Freq: Once | INTRAVENOUS | Status: AC
  Administered 2023-06-02: 1000 mg via INTRAVENOUS

## 2023-06-02 MED ORDER — PHENOL 1.4 % MT LIQD: 1.0000 | OROMUCOSAL | Status: DC | PRN

## 2023-06-02 MED ORDER — DIPHENHYDRAMINE HCL 12.5 MG/5ML PO ELIX: 12.5000 mg | ORAL_SOLUTION | ORAL | Status: DC | PRN

## 2023-06-02 MED ORDER — ASPIRIN 81 MG PO CHEW
CHEWABLE_TABLET | ORAL | Status: AC
  Filled 2023-06-02: qty 1

## 2023-06-02 MED ORDER — SENNOSIDES-DOCUSATE SODIUM 8.6-50 MG PO TABS
1.0000 | ORAL_TABLET | Freq: Two times a day (BID) | ORAL | Status: DC
  Administered 2023-06-02 – 2023-06-03 (×3): 1 via ORAL

## 2023-06-02 MED ORDER — DEXAMETHASONE SODIUM PHOSPHATE 10 MG/ML IJ SOLN
INTRAMUSCULAR | Status: AC
  Filled 2023-06-02: qty 1

## 2023-06-02 MED ORDER — LEVOTHYROXINE SODIUM 75 MCG PO TABS
75.0000 ug | ORAL_TABLET | Freq: Every morning | ORAL | Status: DC
  Administered 2023-06-03: 75 ug via ORAL
  Filled 2023-06-02: qty 1

## 2023-06-02 MED ORDER — BISACODYL 10 MG RE SUPP: 10.0000 mg | Freq: Every day | RECTAL | Status: DC | PRN

## 2023-06-02 MED ORDER — LACTATED RINGERS IV SOLN: INTRAVENOUS | Status: DC

## 2023-06-02 MED ORDER — ACETAMINOPHEN 325 MG PO TABS: 325.0000 mg | ORAL_TABLET | Freq: Four times a day (QID) | ORAL | Status: DC | PRN

## 2023-06-02 MED ORDER — PANTOPRAZOLE SODIUM 40 MG PO TBEC
DELAYED_RELEASE_TABLET | ORAL | Status: AC
  Filled 2023-06-02: qty 1

## 2023-06-02 MED ORDER — DEXAMETHASONE SODIUM PHOSPHATE 10 MG/ML IJ SOLN
8.0000 mg | Freq: Once | INTRAMUSCULAR | Status: AC
  Administered 2023-06-02: 8 mg via INTRAVENOUS

## 2023-06-02 MED ORDER — BUPIVACAINE HCL (PF) 0.5 % IJ SOLN
INTRAMUSCULAR | Status: AC
  Filled 2023-06-02: qty 60

## 2023-06-02 MED ORDER — FLEET ENEMA RE ENEM: 1.0000 | ENEMA | Freq: Once | RECTAL | Status: DC | PRN

## 2023-06-02 MED ORDER — OXYCODONE HCL 5 MG PO TABS: 10.0000 mg | ORAL_TABLET | ORAL | Status: DC | PRN

## 2023-06-02 MED ORDER — FENTANYL CITRATE (PF) 100 MCG/2ML IJ SOLN
INTRAMUSCULAR | Status: DC | PRN
  Administered 2023-06-02 (×2): 50 ug via INTRAVENOUS

## 2023-06-02 MED ORDER — OXYCODONE HCL 5 MG PO TABS
5.0000 mg | ORAL_TABLET | ORAL | Status: DC | PRN
  Administered 2023-06-03: 5 mg via ORAL

## 2023-06-02 MED ORDER — CHLORHEXIDINE GLUCONATE 4 % EX SOLN
60.0000 mL | Freq: Once | CUTANEOUS | Status: AC
  Administered 2023-06-02: 4 via TOPICAL

## 2023-06-02 MED ORDER — SURGIPHOR WOUND IRRIGATION SYSTEM - OPTIME: TOPICAL | Status: DC | PRN

## 2023-06-02 MED ORDER — BUPIVACAINE HCL (PF) 0.5 % IJ SOLN
INTRAMUSCULAR | Status: DC | PRN
  Administered 2023-06-02: 2.8 mL via INTRATHECAL

## 2023-06-02 MED ORDER — ONDANSETRON HCL 4 MG/2ML IJ SOLN: 4.0000 mg | Freq: Once | INTRAMUSCULAR | Status: DC | PRN

## 2023-06-02 MED ORDER — TRANEXAMIC ACID-NACL 1000-0.7 MG/100ML-% IV SOLN
1000.0000 mg | INTRAVENOUS | Status: AC
  Administered 2023-06-02: 1000 mg via INTRAVENOUS

## 2023-06-02 MED ORDER — SODIUM CHLORIDE 0.9 % IR SOLN
Status: DC | PRN
  Administered 2023-06-02: 3000 mL

## 2023-06-02 MED ORDER — GABAPENTIN 300 MG PO CAPS
ORAL_CAPSULE | ORAL | Status: AC
  Filled 2023-06-02: qty 1

## 2023-06-02 MED ORDER — ONDANSETRON HCL 4 MG PO TABS: 4.0000 mg | ORAL_TABLET | Freq: Four times a day (QID) | ORAL | Status: DC | PRN

## 2023-06-02 MED ORDER — MIDAZOLAM HCL 2 MG/2ML IJ SOLN
INTRAMUSCULAR | Status: AC
  Filled 2023-06-02: qty 2

## 2023-06-02 MED ORDER — CHLORHEXIDINE GLUCONATE 0.12 % MT SOLN
OROMUCOSAL | Status: AC
  Filled 2023-06-02: qty 15

## 2023-06-02 SURGICAL SUPPLY — 75 items
ATTUNE PSFEM RTSZ5 NARCEM KNEE (Femur) IMPLANT
ATTUNE PSRP INSR SZ5 5 KNEE (Insert) IMPLANT
BASEPLATE TIBIAL ROTATING SZ 4 (Knees) IMPLANT
BATTERY INSTRU NAVIGATION (MISCELLANEOUS) ×4 IMPLANT
BIT DRILL QUICK REL 1/8 2PK SL (BIT) ×1 IMPLANT
BLADE SAW 70X12.5 (BLADE) ×1 IMPLANT
BLADE SAW 90X13X1.19 OSCILLAT (BLADE) ×1 IMPLANT
BLADE SAW 90X25X1.19 OSCILLAT (BLADE) ×1 IMPLANT
BRUSH SCRUB EZ PLAIN DRY (MISCELLANEOUS) ×1 IMPLANT
BSPLAT TIB 4 CMNT ROT PLAT STR (Knees) ×1 IMPLANT
BTRY SRG DRVR LF (MISCELLANEOUS) ×4
CEMENT HV SMART SET (Cement) IMPLANT
COOLER POLAR GLACIER W/PUMP (MISCELLANEOUS) ×1 IMPLANT
CUFF TOURN SGL QUICK 24 (TOURNIQUET CUFF) ×1
CUFF TOURN SGL QUICK 30 (TOURNIQUET CUFF)
CUFF TRNQT CYL 24X4X16.5-23 (TOURNIQUET CUFF) IMPLANT
CUFF TRNQT CYL 30X4X21-28X (TOURNIQUET CUFF) IMPLANT
DRAPE INCISE IOBAN 66X45 STRL (DRAPES) IMPLANT
DRAPE SHEET LG 3/4 BI-LAMINATE (DRAPES) ×1 IMPLANT
DRSG AQUACEL AG ADV 3.5X14 (GAUZE/BANDAGES/DRESSINGS) ×1 IMPLANT
DRSG MEPILEX SACRM 8.7X9.8 (GAUZE/BANDAGES/DRESSINGS) ×1 IMPLANT
DRSG TEGADERM 4X4.75 (GAUZE/BANDAGES/DRESSINGS) ×1 IMPLANT
DURAPREP 26ML APPLICATOR (WOUND CARE) ×2 IMPLANT
ELECT CAUTERY BLADE 6.4 (BLADE) ×1 IMPLANT
ELECT REM PT RETURN 9FT ADLT (ELECTROSURGICAL) ×1
ELECTRODE REM PT RTRN 9FT ADLT (ELECTROSURGICAL) ×1 IMPLANT
EX-PIN ORTHOLOCK NAV 4X150 (PIN) ×2 IMPLANT
GAUZE XEROFORM 1X8 LF (GAUZE/BANDAGES/DRESSINGS) ×1 IMPLANT
GLOVE BIOGEL M STRL SZ7.5 (GLOVE) ×4 IMPLANT
GLOVE SRG 8 PF TXTR STRL LF DI (GLOVE) ×2 IMPLANT
GLOVE SURG UNDER POLY LF SZ8 (GLOVE) ×2
GOWN STRL REUS W/ TWL LRG LVL3 (GOWN DISPOSABLE) ×1 IMPLANT
GOWN STRL REUS W/ TWL XL LVL3 (GOWN DISPOSABLE) ×1 IMPLANT
GOWN STRL REUS W/TWL LRG LVL3 (GOWN DISPOSABLE) ×1
GOWN STRL REUS W/TWL XL LVL3 (GOWN DISPOSABLE) ×1
GOWN TOGA ZIPPER T7+ PEEL AWAY (MISCELLANEOUS) ×1 IMPLANT
HANDLE YANKAUER SUCT OPEN TIP (MISCELLANEOUS) ×1 IMPLANT
HEMOVAC 400CC 10FR (MISCELLANEOUS) ×1 IMPLANT
HOLDER FOLEY CATH W/STRAP (MISCELLANEOUS) ×1 IMPLANT
HOOD PEEL AWAY T7 (MISCELLANEOUS) ×1 IMPLANT
IV NS IRRIG 3000ML ARTHROMATIC (IV SOLUTION) ×1 IMPLANT
KIT TURNOVER KIT A (KITS) ×1 IMPLANT
KNIFE SCULPS 14X20 (INSTRUMENTS) ×1 IMPLANT
MANIFOLD NEPTUNE II (INSTRUMENTS) ×2 IMPLANT
NDL SPNL 20GX3.5 QUINCKE YW (NEEDLE) ×2 IMPLANT
NEEDLE SPNL 20GX3.5 QUINCKE YW (NEEDLE) ×2 IMPLANT
PACK TOTAL KNEE (MISCELLANEOUS) ×1 IMPLANT
PAD ABD DERMACEA PRESS 5X9 (GAUZE/BANDAGES/DRESSINGS) ×2 IMPLANT
PAD ARMBOARD 7.5X6 YLW CONV (MISCELLANEOUS) ×3 IMPLANT
PAD WRAPON POLAR KNEE (MISCELLANEOUS) ×1 IMPLANT
PATELLA MEDIAL ATTUN 35MM KNEE (Knees) IMPLANT
PENCIL SMOKE EVACUATOR COATED (MISCELLANEOUS) ×1 IMPLANT
PIN DRILL FIX HALF THREAD (BIT) ×2 IMPLANT
PIN FIXATION 1/8DIA X 3INL (PIN) ×1 IMPLANT
PULSAVAC PLUS IRRIG FAN TIP (DISPOSABLE) ×1
SOL PREP PVP 2OZ (MISCELLANEOUS) ×1
SOLUTION IRRIG SURGIPHOR (IV SOLUTION) ×1 IMPLANT
SOLUTION PREP PVP 2OZ (MISCELLANEOUS) ×1 IMPLANT
SPONGE DRAIN TRACH 4X4 STRL 2S (GAUZE/BANDAGES/DRESSINGS) ×1 IMPLANT
STAPLER SKIN PROX 35W (STAPLE) ×1 IMPLANT
STOCKINETTE IMPERV 14X48 (MISCELLANEOUS) ×1 IMPLANT
STRAP TIBIA SHORT (MISCELLANEOUS) ×1 IMPLANT
SUCTION TUBE FRAZIER 10FR DISP (SUCTIONS) ×1 IMPLANT
SUT VIC AB 0 CT1 36 (SUTURE) ×1 IMPLANT
SUT VIC AB 1 CT1 36 (SUTURE) ×2 IMPLANT
SUT VIC AB 2-0 CT2 27 (SUTURE) ×1 IMPLANT
SYR 30ML LL (SYRINGE) ×2 IMPLANT
TIP FAN IRRIG PULSAVAC PLUS (DISPOSABLE) ×1 IMPLANT
TOWEL OR 17X26 4PK STRL BLUE (TOWEL DISPOSABLE) IMPLANT
TOWER CARTRIDGE SMART MIX (DISPOSABLE) ×1 IMPLANT
TRAP FLUID SMOKE EVACUATOR (MISCELLANEOUS) ×1 IMPLANT
TRAY FOLEY MTR SLVR 16FR STAT (SET/KITS/TRAYS/PACK) ×1 IMPLANT
TUBING CONNECTING 10 (TUBING) ×2 IMPLANT
WATER STERILE IRR 1000ML POUR (IV SOLUTION) ×1 IMPLANT
WRAPON POLAR PAD KNEE (MISCELLANEOUS) ×1

## 2023-06-02 NOTE — Interval H&P Note (Signed)
History and Physical Interval Note:  06/02/2023 11:04 AM  Adrienne Horne  has presented today for surgery, with the diagnosis of PRIMARY OSTEOARTHRITIS OF RIGHT KNEE..  The various methods of treatment have been discussed with the patient and family. After consideration of risks, benefits and other options for treatment, the patient has consented to  Procedure(s): COMPUTER ASSISTED TOTAL KNEE ARTHROPLASTY (Right) as a surgical intervention.  The patient's history has been reviewed, patient examined, no change in status, stable for surgery.  I have reviewed the patient's chart and labs.  Questions were answered to the patient's satisfaction.     Jenniferann Stuckert P Drystan Reader

## 2023-06-02 NOTE — Transfer of Care (Signed)
Immediate Anesthesia Transfer of Care Note  Patient: Adrienne Horne  Procedure(s) Performed: COMPUTER ASSISTED TOTAL KNEE ARTHROPLASTY (Right: Knee)  Patient Location: PACU  Anesthesia Type:Spinal  Level of Consciousness: awake and alert   Airway & Oxygen Therapy: Patient Spontanous Breathing  Post-op Assessment: Report given to RN and Post -op Vital signs reviewed and stable  Post vital signs: Reviewed  Last Vitals:  Vitals Value Taken Time  BP 107/75 06/02/23 1507  Temp 36.1 C 06/02/23 1507  Pulse 85 06/02/23 1508  Resp 25 06/02/23 1508  SpO2 98 % 06/02/23 1508  Vitals shown include unfiled device data.  Last Pain:  Vitals:   06/02/23 1039  TempSrc: Tympanic  PainSc: 0-No pain         Complications: No notable events documented.

## 2023-06-02 NOTE — H&P (Signed)
ORTHOPAEDIC HISTORY & PHYSICAL Adrienne Horne, [place] - 05/26/2023 2:30 PM EDT Formatting of this note is different from the original. Chief Complaint: Chief Complaint Patient presents with Pre-op Exam  Adrienne Horne is a 62 y.o. female who presents today for her surgical history and physical for upcoming right total knee arthroplasty. Surgery is scheduled Dr. Ernest Horne on 06/02/2023. The patient continues to report a aching and throbbing pain in the right knee. Pain score today is a 7 out of 10 to the right knee. The patient denies any trauma or injury since her last evaluation. She denies any fevers or chills at this time. She denies any numbness or tingling to the right lower extremity at today's visit. The patient has stopped taking her anti-inflammatories at this time. She denies any personal history of heart attack, stroke, asthma or COPD. No personal history of blood clots. No history of diabetes.  Past Medical History: Past Medical History: Diagnosis Date Chronic left-sided headaches unclear etiology GERD (gastroesophageal reflux disease) Goiter  Past Surgical History: Past Surgical History: Procedure Laterality Date COLONOSCOPY 04/16/2015 Dr. Maggie Horne @ [facility] - Adenomatous Polyp, FHPolyps(m)(f): CBF 04/2020 COLONOSCOPY 03/07/2021 Normal colon biopsy/PHx CP/Repeat 64yrs/TKT Cryosurgery of the cervix TONSILLECTOMY adenoidectomy  Past Family History: Family History Problem Relation Age of Onset Breast cancer Mother 58 Colon polyps Mother Colon polyps Father Pancreatic cancer Father Hypothyroidism Sister No Known Problems Daughter Anxiety Daughter Thyroid disease Maternal Grandmother partial thyroidectomy Emphysema Maternal Grandfather No Known Problems Paternal Grandmother No Known Problems Paternal Grandfather Hyperthyroidism Other Aunt  Medications: Current Outpatient Medications Medication Sig Dispense Refill acetaminophen (TYLENOL) 500 MG tablet Take 500  mg by mouth Taking 6 tablets daily as prescribed cyclobenzaprine (FLEXERIL) 10 MG tablet take 1 tablet by mouth twice a day as needed for muscle spasms 60 tablet 1 HYDROcodone-ibuprofen (VICOPROFEN) 7.5-200 mg tablet Take 1 tablet by mouth 2 (two) times daily as needed for Pain 30 tablet 0 levothyroxine (SYNTHROID) 75 MCG tablet TAKE 1 TABLET DAILY ON EMPTY STOMACH WITH A GLASS OF WATER AT LEAST 30-60 MINUTES BEFORE BREAKFAST. 90 tablet 3 meloxicam (MOBIC) 7.5 MG tablet Take 1 tablet (7.5 mg total) by mouth 2 (two) times daily 180 tablet 3 pantoprazole (PROTONIX) 40 MG DR tablet Take 1 tablet (40 mg total) by mouth once daily 90 tablet 3 valACYclovir (VALTREX) 1000 MG tablet Take 1 tablet (1,000 mg total) by mouth 2 (two) times daily as needed 25 tablet 5  No current facility-administered medications for this visit.  Allergies: No Known Allergies  Review of Systems: A comprehensive 14 point ROS was performed, reviewed by me today, and the pertinent orthopaedic findings are documented in the HPI.  Exam: BP 116/88  Ht 167.6 cm (5\' 6" )  Wt 85.2 kg (187 lb 12.8 oz)  BMI 30.31 kg/m General/Constitutional: The patient appears to be well-nourished, well-developed, and in no acute distress. Neuro/Psych: Normal mood and affect, oriented to person, place and time. Eyes: Non-icteric. Pupils are equal, round, and reactive to light, and exhibit synchronous movement. ENT: Unremarkable. Lymphatic: No palpable adenopathy. Respiratory: Lungs clear to auscultation, Normal chest excursion, No wheezes, and Non-labored breathing Cardiovascular: Regular rate and rhythm. No murmurs. and No edema, swelling or tenderness, except as noted in detailed exam. Integumentary: No impressive skin lesions present, except as noted in detailed exam. Musculoskeletal: Unremarkable, except as noted in detailed exam.  Right Knee: Soft tissue swelling: minimal Effusion: none Erythema: none Crepitance:  minimal Tenderness: medial Alignment: relative varus Mediolateral laxity: medial pseudolaxity Posterior sag:  negative Patellar tracking: Good tracking without evidence of subluxation or tilt Atrophy: No significant atrophy. Quadriceps tone was fair to good. Range of motion: 0/8/130 degrees  Neurologic: Awake, alert, and oriented. Sensory function is intact to pinprick and light touch. Motor strength is judged to be 5/5. Motor coordination is within normal limits. No apparent clonus. No tremor.  Imaging: X-rays of the right knee have been obtained previously in the office and reviewed by me today. These x-rays do demonstrate significant osteoarthritic changes primarily involving the medial compartment with bone-on-bone articulation and associated varus alignment. Osteophyte formation is noted. Subchondral sclerosis is identified. No evidence of fracture or dislocation.  Impression: Primary osteoarthritis of right knee [M17.11] Primary osteoarthritis of right knee (primary encounter diagnosis) Chronic pain of right knee Pre-operative examination  Plan: 1. Treatment options were discussed today with the patient. 2. The patient is scheduled for a right total knee arthroplasty with Dr. Ernest Horne on 06/02/2023. 3. The patient was instructed on the risk and benefits of surgical intervention and wishes to proceed at this time. 4. This document will serve as the surgical history and physical for the patient. 5. The patient will follow-up per standard postop protocol. They can call the clinic they have any questions, new symptoms develop or symptoms worsen.  The procedure was discussed with the patient, as were the potential risks (including bleeding, infection, nerve and/or blood vessel injury, persistent or recurrent pain, failure of the hardware, stiffness, need for further surgery, blood clots, strokes, heart attacks and/or arhythmias, pneumonia, etc.) and benefits. The patient states her  understanding and wishes to proceed.  This office visit took 30 minutes, of which >50% involved patient counseling/education.  Review of the [place] CSRS was performed in accordance of the [facility] prior to dispensing any controlled drugs.  This note was generated in part with voice recognition software and I apologize for any typographical errors that were not detected and corrected.  Valeria Batman, PA-C, CAQ-OS [facility] Electronically signed by Adrienne Oregon, PA at 05/31/2023 7:00 PM EDT

## 2023-06-02 NOTE — Anesthesia Procedure Notes (Signed)
Spinal  Patient location during procedure: OR Reason for block: surgical anesthesia Staffing Performed: resident/CRNA  Anesthesiologist: Corinda Gubler, MD Resident/CRNA: Mathews Argyle, CRNA Performed by: Mathews Argyle, CRNA Authorized by: Corinda Gubler, MD   Preanesthetic Checklist Completed: patient identified, IV checked, site marked, risks and benefits discussed, surgical consent, monitors and equipment checked, pre-op evaluation and timeout performed Spinal Block Patient position: sitting Prep: ChloraPrep and site prepped and draped Patient monitoring: heart rate, continuous pulse ox, blood pressure and cardiac monitor Approach: midline Location: L4-5 Injection technique: single-shot Needle Needle type: Whitacre and Introducer  Needle gauge: 24 G Needle length: 9 cm Assessment Events: CSF return Additional Notes Negative paresthesia. Negative blood return. Positive free-flowing CSF. Expiration date of kit checked and confirmed. Patient tolerated procedure well, without complications.

## 2023-06-02 NOTE — Anesthesia Preprocedure Evaluation (Signed)
Anesthesia Evaluation  Patient identified by MRN, date of birth, ID band Patient awake    Reviewed: Allergy & Precautions, NPO status , Patient's Chart, lab work & pertinent test results  History of Anesthesia Complications Negative for: history of anesthetic complications  Airway Mallampati: III  TM Distance: >3 FB Neck ROM: Full    Dental no notable dental hx. (+) Teeth Intact   Pulmonary neg pulmonary ROS, neg sleep apnea, neg COPD, Patient abstained from smoking.Not current smoker   Pulmonary exam normal breath sounds clear to auscultation       Cardiovascular Exercise Tolerance: Good METS(-) hypertension(-) CAD and (-) Past MI negative cardio ROS (-) dysrhythmias  Rhythm:Regular Rate:Normal - Systolic murmurs    Neuro/Psych  Headaches  negative psych ROS   GI/Hepatic ,GERD  Controlled and Medicated,,(+)     (-) substance abuse    Endo/Other  neg diabetesHypothyroidism    Renal/GU negative Renal ROS     Musculoskeletal  (+) Arthritis ,    Abdominal   Peds  Hematology   Anesthesia Other Findings Past Medical History: No date: Arthritis     Comment:  arthritis in knees No date: Arthritis No date: Chronic left-sided headaches     Comment:  UNCLEAR ETIOLOGY No date: Constipation No date: Family history of breast cancer No date: Fever blister No date: Folliculitis No date: GERD (gastroesophageal reflux disease) No date: Goiter No date: Hashimoto's disease No date: Heart murmur No date: Hypothyroidism No date: Increased BMI No date: Menopause No date: Plantar fasciitis No date: Primary osteoarthritis of left knee No date: Primary osteoarthritis of right knee No date: Vaginal atrophy  Reproductive/Obstetrics                             Anesthesia Physical Anesthesia Plan  ASA: 2  Anesthesia Plan: Spinal   Post-op Pain Management: Gabapentin PO (pre-op)*, Celebrex PO  (pre-op)* and Toradol IV (intra-op)*   Induction: Intravenous  PONV Risk Score and Plan: 2 and Ondansetron, Dexamethasone, Propofol infusion, TIVA and Midazolam  Airway Management Planned: Natural Airway  Additional Equipment: None  Intra-op Plan:   Post-operative Plan:   Informed Consent: I have reviewed the patients History and Physical, chart, labs and discussed the procedure including the risks, benefits and alternatives for the proposed anesthesia with the patient or authorized representative who has indicated his/her understanding and acceptance.       Plan Discussed with: CRNA and Surgeon  Anesthesia Plan Comments: (Discussed R/B/A of neuraxial anesthesia technique with patient: - rare risks of spinal/epidural hematoma, nerve damage, infection - Risk of PDPH - Risk of nausea and vomiting - Risk of conversion to general anesthesia and its associated risks, including sore throat, damage to lips/eyes/teeth/oropharynx, and rare risks such as cardiac and respiratory events. - Risk of allergic reactions  Discussed the role of CRNA in patient's perioperative care.  Patient voiced understanding.)       Anesthesia Quick Evaluation

## 2023-06-02 NOTE — Op Note (Signed)
OPERATIVE NOTE  DATE OF SURGERY:  06/02/2023  PATIENT NAME:  Adrienne Horne   DOB: 27-Dec-1960  MRN: [id number]  PRE-OPERATIVE DIAGNOSIS: Degenerative arthrosis of the right knee, primary  POST-OPERATIVE DIAGNOSIS:  Same  PROCEDURE:  Right total knee arthroplasty using computer-assisted navigation  SURGEON:  Jena Gauss. M.D.  ASSISTANT:  Gean Birchwood, PA-C (present and scrubbed throughout the case, critical for assistance with exposure, retraction, instrumentation, and closure)  ANESTHESIA: spinal  ESTIMATED BLOOD LOSS: 50 mL  FLUIDS REPLACED: 1100 mL of crystalloid  TOURNIQUET TIME: 87 minutes  DRAINS: 2 medium Hemovac drains  SOFT TISSUE RELEASES: Anterior cruciate ligament, posterior cruciate ligament, deep medial collateral ligament, patellofemoral ligament  IMPLANTS UTILIZED: DePuy Attune size 5N posterior stabilized femoral component (cemented), size 4 rotating platform tibial component (cemented), 35 mm medialized dome patella (cemented), and a 5 mm stabilized rotating platform polyethylene insert.  INDICATIONS FOR SURGERY: Adrienne Horne is a 62 y.o. year old female with a long history of progressive knee pain. X-rays demonstrated severe degenerative changes in tricompartmental fashion. The patient had not seen any significant improvement despite conservative nonsurgical intervention. After discussion of the risks and benefits of surgical intervention, the patient expressed understanding of the risks benefits and agree with plans for total knee arthroplasty.   The risks, benefits, and alternatives were discussed at length including but not limited to the risks of infection, bleeding, nerve injury, stiffness, blood clots, the need for revision surgery, cardiopulmonary complications, among others, and they were willing to proceed.  PROCEDURE IN DETAIL: The patient was brought into the operating room and, after adequate spinal anesthesia was achieved, a tourniquet  was placed on the patient's upper thigh. The patient's knee and leg were cleaned and prepped with alcohol and DuraPrep and draped in the usual sterile fashion. A "timeout" was performed as per usual protocol. The lower extremity was exsanguinated using an Esmarch, and the tourniquet was inflated to 300 mmHg. An anterior longitudinal incision was made followed by a standard mid vastus approach. The deep fibers of the medial collateral ligament were elevated in a subperiosteal fashion off of the medial flare of the tibia so as to maintain a continuous soft tissue sleeve. The patella was subluxed laterally and the patellofemoral ligament was incised. Inspection of the knee demonstrated severe degenerative changes with full-thickness loss of articular cartilage. Osteophytes were debrided using a rongeur. Anterior and posterior cruciate ligaments were excised. Two 4.0 mm Schanz pins were inserted in the femur and into the tibia for attachment of the array of trackers used for computer-assisted navigation. Hip center was identified using a circumduction technique. Distal landmarks were mapped using the computer. The distal femur and proximal tibia were mapped using the computer. The distal femoral cutting guide was positioned using computer-assisted navigation so as to achieve a 5 distal valgus cut. The femur was sized and it was felt that a size 5N femoral component was appropriate. A size 5 femoral cutting guide was positioned and the anterior cut was performed and verified using the computer. This was followed by completion of the posterior and chamfer cuts. Femoral cutting guide for the central box was then positioned in the center box cut was performed.  Attention was then directed to the proximal tibia. Medial and lateral menisci were excised. The extramedullary tibial cutting guide was positioned using computer-assisted navigation so as to achieve a 0 varus-valgus alignment and 3 posterior slope. The cut was  performed and verified using the computer. The proximal tibia  was sized and it was felt that a size 4 tibial tray was appropriate. Tibial and femoral trials were inserted followed by insertion of a 5 mm polyethylene insert. This allowed for excellent mediolateral soft tissue balancing both in flexion and in full extension. Finally, the patella was cut and prepared so as to accommodate a 35 mm medialized dome patella. A patella trial was placed and the knee was placed through a range of motion with excellent patellar tracking appreciated. The femoral trial was removed after debridement of posterior osteophytes. The central post-hole for the tibial component was reamed followed by insertion of a keel punch. Tibial trials were then removed. Cut surfaces of bone were irrigated with copious amounts of normal saline using pulsatile lavage and then suctioned dry. Polymethylmethacrylate cement was prepared in the usual fashion using a vacuum mixer. Cement was applied to the cut surface of the proximal tibia as well as along the undersurface of a size 4 rotating platform tibial component. Tibial component was positioned and impacted into place. Excess cement was removed using Personal assistant. Cement was then applied to the cut surfaces of the femur as well as along the posterior flanges of the size 5N femoral component. The femoral component was positioned and impacted into place. Excess cement was removed using Personal assistant. A 5 mm polyethylene trial was inserted and the knee was brought into full extension with steady axial compression applied. Finally, cement was applied to the backside of a 35 mm medialized dome patella and the patellar component was positioned and patellar clamp applied. Excess cement was removed using Personal assistant. After adequate curing of the cement, the tourniquet was deflated after a total tourniquet time of 87 minutes. Hemostasis was achieved using electrocautery. The knee was irrigated with  copious amounts of normal saline using pulsatile lavage followed by 450 ml of Surgiphor and then suctioned dry. 20 mL of 1.3% Exparel and 60 mL of 0.25% Marcaine in 40 mL of normal saline was injected along the posterior capsule, medial and lateral gutters, and along the arthrotomy site. A 5 mm stabilized rotating platform polyethylene insert was inserted and the knee was placed through a range of motion with excellent mediolateral soft tissue balancing appreciated and excellent patellar tracking noted. 2 medium drains were placed in the wound bed and brought out through separate stab incisions. The medial parapatellar portion of the incision was reapproximated using interrupted sutures of #1 Vicryl. Subcutaneous tissue was approximated in layers using first #0 Vicryl followed #2-0 Vicryl. The skin was approximated with skin staples. A sterile dressing was applied.  The patient tolerated the procedure well and was transported to the recovery room in stable condition.    Esai Stecklein P. Angie Fava., M.D.

## 2023-06-02 NOTE — Progress Notes (Signed)
PT Cancellation Note  Patient Details Name: [name] MRN: [id number] DOB: Aug 21, 1961   Cancelled Treatment:    Reason Eval/Treat Not Completed: Other (comment): Per nursing pt with no noted return of strength or sensation to her RLE at this time.  Will attempt to see pt at a future date/time as medically appropriate.     Ovidio Hanger PT, DPT 06/02/23, 4:26 PM

## 2023-06-03 ENCOUNTER — Encounter: Payer: Self-pay | Admitting: Orthopedic Surgery

## 2023-06-03 DIAGNOSIS — M1711 Unilateral primary osteoarthritis, right knee: Secondary | ICD-10-CM | POA: Diagnosis not present

## 2023-06-03 MED ORDER — ACETAMINOPHEN 10 MG/ML IV SOLN
INTRAVENOUS | Status: AC
  Filled 2023-06-03: qty 100

## 2023-06-03 MED ORDER — OXYCODONE HCL 5 MG PO TABS: 5.0000 mg | ORAL_TABLET | ORAL | 0 refills | Status: AC | PRN

## 2023-06-03 MED ORDER — FERROUS SULFATE 325 (65 FE) MG PO TABS
ORAL_TABLET | ORAL | Status: AC
  Filled 2023-06-03: qty 1

## 2023-06-03 MED ORDER — MELOXICAM 7.5 MG PO TABS: 7.5000 mg | ORAL_TABLET | Freq: Two times a day (BID) | ORAL | 1 refills | Status: AC

## 2023-06-03 MED ORDER — CEFAZOLIN SODIUM-DEXTROSE 2-4 GM/100ML-% IV SOLN
INTRAVENOUS | Status: AC
  Filled 2023-06-03: qty 100

## 2023-06-03 MED ORDER — MAGNESIUM HYDROXIDE 400 MG/5ML PO SUSP
ORAL | Status: AC
  Filled 2023-06-03: qty 30

## 2023-06-03 MED ORDER — TRAMADOL HCL 50 MG PO TABS: 50.0000 mg | ORAL_TABLET | ORAL | 0 refills | Status: AC | PRN

## 2023-06-03 MED ORDER — OXYCODONE HCL 5 MG PO TABS
ORAL_TABLET | ORAL | Status: AC
  Filled 2023-06-03: qty 1

## 2023-06-03 MED ORDER — ASPIRIN 81 MG PO CHEW
CHEWABLE_TABLET | ORAL | Status: AC
  Filled 2023-06-03: qty 1

## 2023-06-03 MED ORDER — SENNOSIDES-DOCUSATE SODIUM 8.6-50 MG PO TABS
ORAL_TABLET | ORAL | Status: AC
  Filled 2023-06-03: qty 1

## 2023-06-03 MED ORDER — MELOXICAM 7.5 MG PO TABS
ORAL_TABLET | ORAL | Status: AC
  Filled 2023-06-03: qty 1

## 2023-06-03 MED ORDER — TRAMADOL HCL 50 MG PO TABS
ORAL_TABLET | ORAL | Status: AC
  Filled 2023-06-03: qty 2

## 2023-06-03 MED ORDER — ASPIRIN 81 MG PO CHEW: 81.0000 mg | CHEWABLE_TABLET | Freq: Two times a day (BID) | ORAL | Status: AC

## 2023-06-03 MED ORDER — PANTOPRAZOLE SODIUM 40 MG PO TBEC
DELAYED_RELEASE_TABLET | ORAL | Status: AC
  Filled 2023-06-03: qty 1

## 2023-06-03 MED ORDER — METOCLOPRAMIDE HCL 10 MG PO TABS
ORAL_TABLET | ORAL | Status: AC
  Filled 2023-06-03: qty 1

## 2023-06-03 NOTE — TOC Progression Note (Signed)
Transition of Care [facility]) - Progression Note    Patient Details  Name: Adrienne Horne MRN: [id number] Date of Birth: 02/15/1961  [facility] CM/SW Contact  Marlowe Sax, RN Phone Number: 06/03/2023, 9:45 AM  Clinical Narrative:    The patient lives at home with her husband, RW and 3 in 1 to be delivered to the bedside by [employer]    Expected Discharge Plan: OP Rehab Barriers to Discharge: No Barriers Identified  Expected Discharge Plan and Services   Discharge Planning Services: CM Consult   Living arrangements for the past 2 months: Single Family Home Expected Discharge Date: 06/03/23               DME Arranged: Dan Humphreys rolling, 3-N-1 DME Agency: [employer] Date DME Agency Contacted: 06/03/23 Time DME Agency Contacted: 0900 Representative spoke with at DME Agency: Osvaldo Angst Arranged: NA           Social Determinants of Health (SDOH) Interventions SDOH Screenings   Food Insecurity: No Food Insecurity (06/02/2023)  Housing: Low Risk  (06/02/2023)  Transportation Needs: No Transportation Needs (06/02/2023)  Utilities: Not At Risk (06/02/2023)  Depression (PHQ2-9): Low Risk  (10/20/2022)  Financial Resource Strain: Low Risk  (12/10/2022)   Received from [facility], [facility]  Physical Activity: Inactive (06/06/2019)  Tobacco Use: Low Risk  (06/02/2023)    Readmission Risk Interventions     No data to display

## 2023-06-03 NOTE — Evaluation (Signed)
Occupational Therapy Evaluation Patient Details Name: Adrienne Horne MRN: [id number] DOB: [birth year] Today's Date: 06/03/2023   History of Present Illness Adrienne Horne is a 62 y.o. female who presents for elective R TKA. PMH includes: Arthritis, GERD, Hashimoto's disease.   Clinical Impression   Ms. Adrienne Horne seen for OT evaluation this date, POD#1 from above surgery. Pt was independent in all ADLs prior to surgery, working, driving, and denies falls history in the last six months. Pt reports her functional goal is to be able to get up and walk around her office without pain. Pt is eager to return to PLOF with less pain and improved safety and independence. Pt performs UB/LB dressing, bed/functional mobility, toileting and toilet transfers with modified independence using a RW for stability during mobilization. Otherwise she requires no additional physical assistance or AE to perform ADL management. Pt instructed in polar care mgt, falls prevention strategies, home/routines modifications, & safe use of DME/AE for LB bathing and dressing tasks. Pt return verbalizes understanding of all education/instruction. No further skilled OT needs identified. Will DC in house. Do not currently anticipate any OT needs following this hospitalization.  Pleas re-consult if additional OT needs arise during this hospitalization.          If plan is discharge home, recommend the following: Assist for transportation;Help with stairs or ramp for entrance;Assistance with cooking/housework    Functional Status Assessment  Patient has not had a recent decline in their functional status  Equipment Recommendations  None recommended by OT    Recommendations for Other Services       Precautions / Restrictions Precautions Precautions: Knee;Fall Precaution Booklet Issued: No Restrictions Weight Bearing Restrictions: Yes RLE Weight Bearing: Weight bearing as tolerated      Mobility Bed Mobility Overal bed  mobility: Modified Independent                  Transfers Overall transfer level: Modified independent Equipment used: Rolling walker (2 wheels)               General transfer comment: Good confidence and safety awareness with STS using a RW.      Balance Overall balance assessment: Needs assistance Sitting-balance support: Feet supported, No upper extremity supported Sitting balance-Leahy Scale: Normal Sitting balance - Comments: steady with dynamic sitting balance during LB dressing.   Standing balance support: During functional activity, No upper extremity supported Standing balance-Leahy Scale: Good Standing balance comment: steady static standing at sink without UE support.                           ADL either performed or assessed with clinical judgement   ADL Overall ADL's : Needs assistance/impaired                                       General ADL Comments: Pt able to perform bed/functional mobility, LB/UB dressing, and toileting/toilet transfer with MOD I using a RW. She return verbalizes understanding of education on compensatory management strategies for ADLs in setting of R TKA and reports feeling at or near baseline functional independence despite mild R knee pain with mobility. Has family available 24/7 to assist with ADLs/IADLs PRN.     Vision Baseline Vision/History: 1 Wears glasses Ability to See in Adequate Light: 1 Impaired Patient Visual Report: No change from baseline  Perception         Praxis         Pertinent Vitals/Pain Pain Assessment Pain Assessment: Faces Faces Pain Scale: Hurts a little bit Pain Location: RLE with mobility Pain Descriptors / Indicators: Discomfort, Sore Pain Intervention(s): Limited activity within patient's tolerance, Monitored during session, Repositioned, Ice applied     Extremity/Trunk Assessment Upper Extremity Assessment Upper Extremity Assessment: Overall WFL for  tasks assessed   Lower Extremity Assessment Lower Extremity Assessment: RLE deficits/detail;Defer to PT evaluation RLE Deficits / Details: s/p R TKA, WBAT RLE Coordination: decreased gross motor;decreased fine motor       Communication Communication Communication: No apparent difficulties Cueing Techniques: Verbal cues   Cognition Arousal: Alert Behavior During Therapy: WFL for tasks assessed/performed Overall Cognitive Status: Within Functional Limits for tasks assessed                                 General Comments: Pleasant, conversational, eager to participate in therapy session.     General Comments       Exercises Other Exercises Other Exercises: Pt educated in falls prevention strategies, safe use of AE/DME for LB ADL management, polar care management, complementary alternative methods for pain management including gentle self-massage and distraction techniques, and routines modifications to support safety and fxl independence during meaningful occupations of daily life   Shoulder Instructions      Home Living Family/patient expects to be discharged to:: Private residence Living Arrangements: Spouse/significant other Available Help at Discharge: Family;Available 24 hours/day Type of Home: House Home Access: Stairs to enter [employer] of Steps: 3 Entrance Stairs-Rails: Left Home Layout: One level     Bathroom Shower/Tub: Chief Strategy Officer: Standard     Home Equipment: Firefighter;Shower seat          Prior Functioning/Environment Prior Level of Function : Independent/Modified Independent;Working/employed;Driving             Mobility Comments: Independent without a device. No falls. ADLs Comments: Working, driving, independent with ADL/IADL management.        OT Problem List: Decreased range of motion;Decreased coordination;Decreased knowledge of use of DME or AE;Impaired balance (sitting and/or  standing);Decreased safety awareness;Decreased knowledge of precautions      OT Treatment/Interventions:      OT Goals(Current goals can be found in the care plan section) Acute Rehab OT Goals Patient Stated Goal: To go home OT Goal Formulation: All assessment and education complete, DC therapy Time For Goal Achievement: 06/03/23 Potential to Achieve Goals: Good  OT Frequency:      Co-evaluation              AM-PAC OT "6 Clicks" Daily Activity     Outcome Measure Help from another person eating meals?: None Help from another person taking care of personal grooming?: None Help from another person toileting, which includes using toliet, bedpan, or urinal?: None Help from another person bathing (including washing, rinsing, drying)?: A Little Help from another person to put on and taking off regular upper body clothing?: None Help from another person to put on and taking off regular lower body clothing?: None 6 Click Score: 23   End of Session Equipment Utilized During Treatment: Rolling walker (2 wheels) Nurse Communication: Mobility status  Activity Tolerance: Patient tolerated treatment well Patient left: in chair;with call bell/phone within reach  OT Visit Diagnosis: Other abnormalities of gait and mobility (R26.89);Pain Pain -  Right/Left: Right Pain - part of body: Knee;Leg                Time: 8657-8469 OT Time Calculation (min): 30 min Charges:  OT General Charges $OT Visit: 1 Visit OT Evaluation $OT Eval Low Complexity: 1 Low OT Treatments $Self Care/Home Management : 8-22 mins  Rockney Ghee, M.S., OTR/L 06/03/23, 11:06 AM

## 2023-06-03 NOTE — Plan of Care (Signed)
  Problem: Activity: Goal: Ability to avoid complications of mobility impairment will improve Outcome: Progressing   Problem: Pain Management: Goal: Pain level will decrease with appropriate interventions Outcome: Progressing   Problem: Activity: Goal: Risk for activity intolerance will decrease Outcome: Progressing   

## 2023-06-03 NOTE — Plan of Care (Signed)
  Problem: Activity: Goal: Ability to avoid complications of mobility impairment will improve Outcome: Progressing Goal: Range of joint motion will improve Outcome: Progressing   Problem: Pain Management: Goal: Pain level will decrease with appropriate interventions Outcome: Progressing   Problem: Activity: Goal: Risk for activity intolerance will decrease Outcome: Progressing   Problem: Elimination: Goal: Will not experience complications related to urinary retention Outcome: Progressing   Problem: Pain Managment: Goal: General experience of comfort will improve Outcome: Progressing

## 2023-06-03 NOTE — Discharge Summary (Signed)
Physician Discharge Summary  Subjective: 1 Day Post-Op Procedure(s) (LRB): COMPUTER ASSISTED TOTAL KNEE ARTHROPLASTY (Right) Patient reports pain as mild.   Patient seen in rounds with Dr. Ernest Pine. Patient is well, and has had no acute complaints or problems.  Denies having any chest pain, shortness of breath, fevers, chills, nausea or vomiting. She has passed all of her PT protocols Plan is ready to go Home after hospital stay.   Physician Discharge Summary  Patient ID: Adrienne Horne MRN: [id number] DOB/AGE: 05/20/1961 62 y.o.  Admit date: 06/02/2023 Discharge date: 06/03/2023  Admission Diagnoses:  Discharge Diagnoses:  Principal Problem:   Total knee replacement status   Discharged Condition: good  Hospital Course: Patient presented on the hospital on 06/02/2019 for for an elective right total knee arthroplasty performed by Dr. Ernest Pine.  Patient was given 1 g of TXA and 2 g of Ancef perioperatively.  She tolerated the surgery well without any complications.  See procedural note for details below.  Postoperatively, the patient was doing very well.  She was able to pass her PT protocols on postop day 1.  Her drain was removed which was intact and without difficulty.  She denies having any chest pain, shortness of breath, fevers, chills or nausea/vomiting.  Her vital signs are stable.  And she states she is ready to go home.  Patient is stable for discharge.  PROCEDURE:  Right total knee arthroplasty using computer-assisted navigation   SURGEON:  Jena Gauss. M.D.   ASSISTANT:  Gean Birchwood, PA-C (present and scrubbed throughout the case, critical for assistance with exposure, retraction, instrumentation, and closure)   ANESTHESIA: spinal   ESTIMATED BLOOD LOSS: 50 mL   FLUIDS REPLACED: 1100 mL of crystalloid   TOURNIQUET TIME: 87 minutes   DRAINS: 2 medium Hemovac drains   SOFT TISSUE RELEASES: Anterior cruciate ligament, posterior cruciate ligament, deep medial  collateral ligament, patellofemoral ligament   IMPLANTS UTILIZED: DePuy Attune size 5N posterior stabilized femoral component (cemented), size 4 rotating platform tibial component (cemented), 35 mm medialized dome patella (cemented), and a 5 mm stabilized rotating platform polyethylene insert.  Treatments: None  Discharge Exam: Blood pressure 121/82, pulse 83, temperature (!) 97.2 F (36.2 C), temperature source Temporal, resp. rate 16, height 5\' 6"  (1.676 m), weight 80.7 kg, SpO2 97%.   Disposition: Home   Allergies as of 06/03/2023       Reactions   Penicillin G Rash   IgE = 86 (WNL) on 05/25/2023 In high doses.  Ok to take amoxicillin        Medication List     TAKE these medications    acetaminophen 500 MG tablet Commonly known as: TYLENOL Take 1,000 mg by mouth every 8 (eight) hours as needed.   aspirin 81 MG chewable tablet Chew 1 tablet (81 mg total) by mouth 2 (two) times daily.   cyclobenzaprine 10 MG tablet Commonly known as: FLEXERIL Take 10 mg by mouth 2 (two) times daily as needed.   HYDROcodone-ibuprofen 7.5-200 MG tablet Commonly known as: VICOPROFEN Take 1 tablet by mouth 2 (two) times daily as needed for severe pain.   levothyroxine 75 MCG tablet Commonly known as: SYNTHROID Take 75 mcg by mouth every morning.   meloxicam 7.5 MG tablet Commonly known as: MOBIC Take 1 tablet (7.5 mg total) by mouth 2 (two) times daily. What changed:  medication strength when to take this additional instructions   oxyCODONE 5 MG immediate release tablet Commonly known as: Oxy IR/ROXICODONE Take 1  tablet (5 mg total) by mouth every 4 (four) hours as needed for moderate pain (pain score 4-6).   pantoprazole 40 MG tablet Commonly known as: PROTONIX Take 1 tablet (40 mg total) by mouth daily.   traMADol 50 MG tablet Commonly known as: ULTRAM Take 1-2 tablets (50-100 mg total) by mouth every 4 (four) hours as needed for moderate pain.   valACYclovir 1000 MG  tablet Commonly known as: VALTREX Take 2 tablets by mouth 2 (two) times daily as needed (cold sores).               Durable Medical Equipment  (From admission, onward)           Start     Ordered   06/02/23 1537  DME Walker rolling  Once       Question:  Patient needs a walker to treat with the following condition  Answer:  Total knee replacement status   06/02/23 1536   06/02/23 1537  DME Bedside commode  Once       Comments: Patient is not able to walk the distance required to go the bathroom, or he/she is unable to safely negotiate stairs required to access the bathroom.  A 3in1 BSC will alleviate this problem  Question:  Patient needs a bedside commode to treat with the following condition  Answer:  Total knee replacement status   06/02/23 1536            Follow-up Information     Rayburn Go, PA-C Follow up on 06/17/2023.   Specialty: Orthopedic Surgery Why: at 10:15am Contact information: [address] [place] [address] [phone]         Donato Heinz, MD Follow up on 07/15/2023.   Specialty: Orthopedic Surgery Why: at 2:30pm Contact information: [address] [facility] [place] [address] [phone]                 Signed: Gean Birchwood 06/03/2023, 7:35 AM   Objective: Vital signs in last 24 hours: Temp:  [96.9 F (36.1 C)-98 F (36.7 C)] 97.2 F (36.2 C) (09/19 0500) Pulse Rate:  [67-91] 83 (09/19 0500) Resp:  [16-20] 16 (09/19 0500) BP: (106-152)/(74-96) 121/82 (09/19 0500) SpO2:  [97 %-100 %] 97 % (09/19 0500) Weight:  [80.7 kg] 80.7 kg (09/18 1039)  Intake/Output from previous day:  Intake/Output Summary (Last 24 hours) at 06/03/2023 0735 Last data filed at 06/03/2023 0500 Gross per 24 hour  Intake 3235.19 ml  Output 665 ml  Net 2570.19 ml    Intake/Output this shift: No intake/output data recorded.  Labs: No results for input(s): "HGB" in the last 72 hours. No results for  input(s): "WBC", "RBC", "HCT", "PLT" in the last 72 hours. No results for input(s): "NA", "K", "CL", "CO2", "BUN", "CREATININE", "GLUCOSE", "CALCIUM" in the last 72 hours. No results for input(s): "LABPT", "INR" in the last 72 hours.  EXAM: General - Patient is Alert, Appropriate, and Oriented Extremity - Neurologically intact ABD soft Neurovascular intact Sensation intact distally Intact pulses distally Dorsiflexion/Plantar flexion intact No cellulitis present Compartment soft Dressing - dressing C/D/I and no drainage Motor Function - intact, moving foot and toes well on exam.  Patient able to plantar and dorsiflex with good strength and range of motion.  Patient able to straight leg raise without any difficulty or assistance.  Patient is neurovascularly intact down her right lower extremity to all dermatomes.  Posterior tibial pulses appreciated, 2+ JP Drain pulled without difficulty.  Intact  Assessment/Plan: 1 Day Post-Op Procedure(s) (LRB): COMPUTER ASSISTED TOTAL KNEE ARTHROPLASTY (Right) Procedure(s) (LRB): COMPUTER ASSISTED TOTAL KNEE ARTHROPLASTY (Right) Past Medical History:  Diagnosis Date   Arthritis    arthritis in knees   Arthritis    Chronic left-sided headaches    UNCLEAR ETIOLOGY   Constipation    Family history of breast cancer    Fever blister    Folliculitis    GERD (gastroesophageal reflux disease)    Goiter    Hashimoto's disease    Heart murmur    Hypothyroidism    Increased BMI    Menopause    Plantar fasciitis    Primary osteoarthritis of left knee    Primary osteoarthritis of right knee    Vaginal atrophy    Principal Problem:   Total knee replacement status  Estimated body mass index is 28.73 kg/m as calculated from the following:   Height as of this encounter: 5\' 6"  (1.676 m).   Weight as of this encounter: 80.7 kg.   Patient has passed all of her PT protocols postoperatively.  She is ready to be discharged home.  She will look to  transition to home health physical therapy.  Continue to work on strength, ROM and gait.   Discussed with the patient continuing to utilize Polar Care   Patient will use bone foam in 20-30 minute intervals   Patient will wear TED hose bilaterally to help prevent DVT and clot formation   Discussed the Aquacel bandage.  This bandage will stay in place 7 days postoperatively.  Can be replaced with honeycomb bandages that will be sent home with the patient   Discussed sending the patient home with tramadol and oxycodone for as needed pain management.  Patient will also be sent home with Celebrex to help with swelling and inflammation.  Patient will take an 81 mg aspirin twice daily for DVT prophylaxis   JP drain removed without difficulty, intact   Weight-Bearing as tolerated to right leg   Patient will follow-up with [facility] orthopedics in 2 weeks for staple removal and reevaluation  Diet - Regular diet Follow up - in 2 weeks Activity - WBAT Disposition - Home Condition Upon Discharge - Good DVT Prophylaxis - Aspirin and TED hose  Danise Edge, PA-C Orthopaedic Surgery 06/03/2023, 7:35 AM

## 2023-06-03 NOTE — Evaluation (Addendum)
Physical Therapy Evaluation Patient Details Name: Adrienne Horne MRN: [id number] DOB: October 19, 1960 Today's Date: 06/03/2023  History of Present Illness  Adrienne Horne is a 62 y.o. female who presents for elective R TKA. PMH includes: Arthritis, GERD, Hashimoto's disease.  Clinical Impression   Pt presents seated in recliner following OT session, no complaints of pain. She currently lives with her husband and daughter in a 1 story home with 3 stairs to enter and a rail on the L. PTA she was independent with all mobility and ADLs.   Pt able to perform sit<>stand from recliner with CGA, but assistance not needed as she demonstrates safe mobility practices. She tolerated ambulating ~167ft with CGA/RW and ascending/descending 5 steps with a rail on the L and CGA. PT educated on safe stair negotiation and car transfers at the end of  session. She would benefit from continued skilled therapy to maximize functional abilities.       If plan is discharge home, recommend the following: A little help with walking and/or transfers;A little help with bathing/dressing/bathroom;Assistance with cooking/housework;Help with stairs or ramp for entrance;Assist for transportation   Can travel by private vehicle        Equipment Recommendations Rolling walker (2 wheels)  Recommendations for Other Services       Functional Status Assessment Patient has had a recent decline in their functional status and demonstrates the ability to make significant improvements in function in a reasonable and predictable amount of time.     Precautions / Restrictions Precautions Precautions: Knee;Fall Precaution Booklet Issued: Yes (comment) Restrictions Weight Bearing Restrictions: Yes RLE Weight Bearing: Weight bearing as tolerated      Mobility  Bed Mobility               General bed mobility comments: did not observe during session    Transfers Overall transfer level: Needs assistance Equipment  used: Rolling walker (2 wheels) Transfers: Sit to/from Stand Sit to Stand: Contact guard assist           General transfer comment: CGA provided but overall not needed, pt exhibits good safety awareness and form with transfers    Ambulation/Gait   Gait Distance (Feet): 150 Feet Assistive device: Rolling walker (2 wheels) Gait Pattern/deviations: Step-through pattern, Decreased step length - left, Decreased step length - right, Decreased stance time - right, Decreased weight shift to right Gait velocity: decreased     General Gait Details: iniital forward flexed posture but able to correct with cueing  Stairs Stairs: Yes Stairs assistance: Contact guard assist Stair Management: One rail Left Number of Stairs: 5 General stair comments: ascend/descend  Wheelchair Mobility     Tilt Bed    Modified Rankin (Stroke Patients Only)       Balance Overall balance assessment: Needs assistance Sitting-balance support: Feet supported, No upper extremity supported Sitting balance-Leahy Scale: Normal     Standing balance support: During functional activity, Bilateral upper extremity supported Standing balance-Leahy Scale: Good                               Pertinent Vitals/Pain Pain Assessment Pain Assessment: No/denies pain    Home Living Family/patient expects to be discharged to:: Private residence Living Arrangements: Spouse/significant other Available Help at Discharge: Family;Available 24 hours/day Type of Home: House Home Access: Stairs to enter Entrance Stairs-Rails: Left Entrance Stairs-Number of Steps: 3   Home Layout: One level Home Equipment: Standard Walker;Shower seat  Prior Function Prior Level of Function : Independent/Modified Independent;Working/employed;Driving             Mobility Comments: does not use AD at baseline ADLs Comments: IND with ADLs     Extremity/Trunk Assessment   Upper Extremity Assessment Upper  Extremity Assessment: Overall WFL for tasks assessed    Lower Extremity Assessment Lower Extremity Assessment: RLE deficits/detail RLE Deficits / Details: decreased mobility/ strength 2/2 recent procedure RLE Coordination: decreased gross motor;decreased fine motor       Communication   Communication Communication: No apparent difficulties Cueing Techniques: Verbal cues  Cognition Arousal: Alert Behavior During Therapy: WFL for tasks assessed/performed Overall Cognitive Status: Within Functional Limits for tasks assessed                                          General Comments      Exercises Total Joint Exercises Goniometric ROM: R knee flexion: 80* Other Exercises Other Exercises: PT educated on car transfers and safe stair negotiation, verbalized understanding   Assessment/Plan    PT Assessment Patient needs continued PT services  PT Problem List Decreased strength;Decreased range of motion;Decreased activity tolerance;Decreased balance;Decreased mobility;Decreased knowledge of use of DME;Decreased knowledge of precautions       PT Treatment Interventions DME instruction;Gait training;Stair training;Functional mobility training;Therapeutic activities;Therapeutic exercise;Balance training;Neuromuscular re-education;Patient/family education    PT Goals (Current goals can be found in the Care Plan section)  Acute Rehab PT Goals Patient Stated Goal: return home PT Goal Formulation: With patient Time For Goal Achievement: 06/17/23 Potential to Achieve Goals: Good    Frequency BID     Co-evaluation               AM-PAC PT "6 Clicks" Mobility  Outcome Measure Help needed turning from your back to your side while in a flat bed without using bedrails?: None Help needed moving from lying on your back to sitting on the side of a flat bed without using bedrails?: None Help needed moving to and from a bed to a chair (including a wheelchair)?: A  Little Help needed standing up from a chair using your arms (e.g., wheelchair or bedside chair)?: None Help needed to walk in hospital room?: A Little Help needed climbing 3-5 steps with a railing? : A Little 6 Click Score: 21    End of Session Equipment Utilized During Treatment: Gait belt Activity Tolerance: Patient tolerated treatment well Patient left: in chair;Other (comment) (MD in room) Nurse Communication: Mobility status PT Visit Diagnosis: Other abnormalities of gait and mobility (R26.89)    Time: 0981-1914 PT Time Calculation (min) (ACUTE ONLY): 13 min   Charges:   PT Evaluation $PT Eval Low Complexity: 1 Low   PT General Charges $$ ACUTE PT VISIT: 1 Visit        Zuriel Roskos, PT, SPT 11:30 AM,06/03/23

## 2023-06-03 NOTE — Progress Notes (Signed)
DISCHARGE NOTE  Pt given discharge instructions and verbalized understanding. TED hose on both legs, BSC and walker sent with pt. Pt wheeled to car by staff. Family providing transportation.

## 2023-06-03 NOTE — Anesthesia Postprocedure Evaluation (Signed)
Anesthesia Post Note  Patient: Adrienne Horne  Procedure(s) Performed: COMPUTER ASSISTED TOTAL KNEE ARTHROPLASTY (Right: Knee)  Patient location during evaluation: Nursing Unit Anesthesia Type: Spinal Level of consciousness: oriented and awake and alert Pain management: pain level controlled Vital Signs Assessment: post-procedure vital signs reviewed and stable Respiratory status: spontaneous breathing and respiratory function stable Cardiovascular status: blood pressure returned to baseline and stable Postop Assessment: no headache, no backache, no apparent nausea or vomiting and patient able to bend at knees Anesthetic complications: no   Patient has no complaints at present.  No notable events documented.   Last Vitals:  Vitals:   06/03/23 0500 06/03/23 0803  BP: 121/82 123/86  Pulse: 83 84  Resp: 16 16  Temp: (!) 36.2 C (!) 36.3 C  SpO2: 97% 100%    Last Pain:  Vitals:   06/03/23 0803  TempSrc: Oral  PainSc: 0-No pain                 Starling Manns

## 2023-06-03 NOTE — Progress Notes (Signed)
Patient is not able to walk the distance required to go the bathroom, or he/she is unable to safely negotiate stairs required to access the bathroom.  A 3in1 BSC will alleviate this problem

## 2023-06-03 NOTE — Progress Notes (Signed)
Subjective: 1 Day Post-Op Procedure(s) (LRB): COMPUTER ASSISTED TOTAL KNEE ARTHROPLASTY (Right) Patient reports pain as mild.   Patient seen in rounds with Dr. Ernest Pine. Patient is well, and has had no acute complaints or problems.  Denies having any chest pain, shortness of breath, fevers, chills, nausea or vomiting. We will start therapy today.  Plan is to go Home after hospital stay.  Objective: Vital signs in last 24 hours: Temp:  [96.9 F (36.1 C)-98 F (36.7 C)] 97.2 F (36.2 C) (09/19 0500) Pulse Rate:  [67-91] 83 (09/19 0500) Resp:  [16-20] 16 (09/19 0500) BP: (106-152)/(74-96) 121/82 (09/19 0500) SpO2:  [97 %-100 %] 97 % (09/19 0500) Weight:  [80.7 kg] 80.7 kg (09/18 1039)  Intake/Output from previous day:  Intake/Output Summary (Last 24 hours) at 06/03/2023 0717 Last data filed at 06/03/2023 0500 Gross per 24 hour  Intake 3235.19 ml  Output 665 ml  Net 2570.19 ml    Intake/Output this shift: No intake/output data recorded.  Labs: No results for input(s): "HGB" in the last 72 hours. No results for input(s): "WBC", "RBC", "HCT", "PLT" in the last 72 hours. No results for input(s): "NA", "K", "CL", "CO2", "BUN", "CREATININE", "GLUCOSE", "CALCIUM" in the last 72 hours. No results for input(s): "LABPT", "INR" in the last 72 hours.  EXAM General - Patient is Alert, Appropriate, and Oriented Extremity - Neurologically intact ABD soft Neurovascular intact Sensation intact distally Intact pulses distally Dorsiflexion/Plantar flexion intact No cellulitis present Compartment soft Dressing - dressing C/D/I and no drainage Motor Function - intact, moving foot and toes well on exam.  Patient able to plantar and dorsiflex with good strength and range of motion.  Patient able to straight leg raise without any difficulty or assistance.  Patient is neurovascularly intact down her right lower extremity to all dermatomes.  Posterior tibial pulses appreciated, 2+ JP Drain pulled  without difficulty. Intact  Past Medical History:  Diagnosis Date   Arthritis    arthritis in knees   Arthritis    Chronic left-sided headaches    UNCLEAR ETIOLOGY   Constipation    Family history of breast cancer    Fever blister    Folliculitis    GERD (gastroesophageal reflux disease)    Goiter    Hashimoto's disease    Heart murmur    Hypothyroidism    Increased BMI    Menopause    Plantar fasciitis    Primary osteoarthritis of left knee    Primary osteoarthritis of right knee    Vaginal atrophy     Assessment/Plan: 1 Day Post-Op Procedure(s) (LRB): COMPUTER ASSISTED TOTAL KNEE ARTHROPLASTY (Right) Principal Problem:   Total knee replacement status  Estimated body mass index is 28.73 kg/m as calculated from the following:   Height as of this encounter: 5\' 6"  (1.676 m).   Weight as of this encounter: 80.7 kg. Advance diet Up with therapy  Patient will continue to work with physical therapy to pass postoperative PT protocols, ROM and strengthening  Discussed with the patient continuing to utilize Polar Care  Patient will use bone foam in 20-30 minute intervals  Patient will wear TED hose bilaterally to help prevent DVT and clot formation  Discussed the Aquacel bandage.  This bandage will stay in place 7 days postoperatively.  Can be replaced with honeycomb bandages that will be sent home with the patient  Discussed sending the patient home with tramadol and oxycodone for as needed pain management.  Patient will also be sent home with  Celebrex to help with swelling and inflammation.  Patient will take an 81 mg aspirin twice daily for DVT prophylaxis  JP drain removed without difficulty, intact  Weight-Bearing as tolerated to right leg  Patient will follow-up with [facility] in 2 weeks for staple removal and reevaluation  Rayburn Go, PA-C [facility] 06/03/2023, 7:17 AM

## 2023-09-06 ENCOUNTER — Other Ambulatory Visit: Payer: Self-pay

## 2023-12-09 ENCOUNTER — Other Ambulatory Visit: Payer: Self-pay

## 2023-12-09 MED ORDER — PANTOPRAZOLE SODIUM 40 MG PO TBEC
40.0000 mg | DELAYED_RELEASE_TABLET | Freq: Every day | ORAL | 3 refills | Status: AC
  Filled 2023-12-09: qty 90, 90d supply, fill #0
  Filled 2024-03-22: qty 90, 90d supply, fill #1
  Filled 2024-06-28: qty 90, 90d supply, fill #2
  Filled 2024-10-12: qty 90, 90d supply, fill #3

## 2023-12-14 ENCOUNTER — Other Ambulatory Visit: Payer: Self-pay

## 2023-12-15 ENCOUNTER — Other Ambulatory Visit: Payer: Self-pay | Admitting: Internal Medicine

## 2023-12-15 DIAGNOSIS — Z1231 Encounter for screening mammogram for malignant neoplasm of breast: Secondary | ICD-10-CM

## 2024-01-28 ENCOUNTER — Other Ambulatory Visit: Payer: Self-pay

## 2024-01-28 MED ORDER — MELOXICAM 7.5 MG PO TABS
7.5000 mg | ORAL_TABLET | Freq: Two times a day (BID) | ORAL | 3 refills | Status: AC
  Filled 2024-01-28: qty 180, 90d supply, fill #0
  Filled 2024-06-02 – 2024-06-06 (×2): qty 180, 90d supply, fill #1
  Filled 2024-09-27: qty 180, 90d supply, fill #2

## 2024-02-14 ENCOUNTER — Other Ambulatory Visit: Payer: Self-pay

## 2024-02-14 MED ORDER — LEVOFLOXACIN 500 MG PO TABS
500.0000 mg | ORAL_TABLET | Freq: Every day | ORAL | 0 refills | Status: AC
  Filled 2024-02-14: qty 10, 10d supply, fill #0

## 2024-02-14 MED ORDER — PREDNISONE 20 MG PO TABS
ORAL_TABLET | ORAL | 0 refills | Status: DC
  Filled 2024-02-14: qty 9, 8d supply, fill #0
  Filled 2024-02-14: qty 3, 1d supply, fill #0

## 2024-02-16 ENCOUNTER — Other Ambulatory Visit: Payer: Self-pay

## 2024-02-16 MED ORDER — HYDROCOD POLI-CHLORPHE POLI ER 10-8 MG/5ML PO SUER
5.0000 mL | Freq: Two times a day (BID) | ORAL | 0 refills | Status: DC | PRN
  Filled 2024-02-16 – 2024-02-21 (×4): qty 115, 12d supply, fill #0

## 2024-02-17 ENCOUNTER — Other Ambulatory Visit: Payer: Self-pay

## 2024-02-18 ENCOUNTER — Other Ambulatory Visit: Payer: Self-pay

## 2024-02-21 ENCOUNTER — Other Ambulatory Visit: Payer: Self-pay

## 2024-02-22 ENCOUNTER — Other Ambulatory Visit: Payer: Self-pay

## 2024-03-13 ENCOUNTER — Other Ambulatory Visit: Payer: Self-pay

## 2024-03-22 ENCOUNTER — Other Ambulatory Visit: Payer: Self-pay

## 2024-04-04 ENCOUNTER — Ambulatory Visit
Admission: RE | Admit: 2024-04-04 | Discharge: 2024-04-04 | Disposition: A | Discharge status: Home / Self Care | Source: Ambulatory Visit | Attending: Internal Medicine | Admitting: Internal Medicine

## 2024-04-04 DIAGNOSIS — Z1231 Encounter for screening mammogram for malignant neoplasm of breast: Secondary | ICD-10-CM | POA: Insufficient documentation

## 2024-06-05 ENCOUNTER — Other Ambulatory Visit (INDEPENDENT_AMBULATORY_CARE_PROVIDER_SITE_OTHER): Payer: Self-pay | Admitting: Nurse Practitioner

## 2024-06-05 DIAGNOSIS — I83893 Varicose veins of bilateral lower extremities with other complications: Secondary | ICD-10-CM

## 2024-06-06 ENCOUNTER — Other Ambulatory Visit: Payer: Self-pay

## 2024-06-07 ENCOUNTER — Ambulatory Visit (INDEPENDENT_AMBULATORY_CARE_PROVIDER_SITE_OTHER): Payer: Self-pay

## 2024-06-07 ENCOUNTER — Encounter (INDEPENDENT_AMBULATORY_CARE_PROVIDER_SITE_OTHER): Payer: Self-pay | Admitting: Nurse Practitioner

## 2024-06-07 ENCOUNTER — Ambulatory Visit (INDEPENDENT_AMBULATORY_CARE_PROVIDER_SITE_OTHER): Payer: Self-pay | Admitting: Nurse Practitioner

## 2024-06-07 VITALS — BP 129/88 | HR 73 | Wt 176.1 lb

## 2024-06-07 DIAGNOSIS — I83893 Varicose veins of bilateral lower extremities with other complications: Secondary | ICD-10-CM | POA: Diagnosis not present

## 2024-06-07 DIAGNOSIS — I83812 Varicose veins of left lower extremities with pain: Secondary | ICD-10-CM

## 2024-06-27 ENCOUNTER — Encounter (INDEPENDENT_AMBULATORY_CARE_PROVIDER_SITE_OTHER): Payer: Self-pay | Admitting: Nurse Practitioner

## 2024-06-27 NOTE — Progress Notes (Signed)
 Subjective:    Patient ID: Adrienne Horne, female    DOB: 1960/10/12, 63 y.o.   MRN: [id number] Chief Complaint  Patient presents with   Establish Care    The patient is seen for evaluation of symptomatic varicose veins. The patient relates burning and stinging which worsened steadily throughout the course of the day, particularly with standing. The patient also notes an aching and throbbing pain over the varicosities, particularly with prolonged dependent positions. The symptoms are significantly improved with elevation.  The patient also notes that during hot weather the symptoms are greatly intensified. The patient states the pain from the varicose veins interferes with work, daily exercise, shopping and household maintenance. At this point, the symptoms are persistent and severe enough that they're having a negative impact on lifestyle and are interfering with daily activities.  There is no history of DVT, PE or superficial thrombophlebitis. There is no history of ulceration or hemorrhage. The patient denies a significant family history of varicose veins.  The patient has worn graduated compression in the past. At the present time the patient has  been using over-the-counter analgesics. There is no history of prior surgical intervention or sclerotherapy.  The patient has no evidence of DVT or superficial thrombophlebitis bilaterally.  No evidence of deep venous insufficiency noted bilaterally.  No evidence of superficial venous reflux in the left lower extremity.  There is superficial venous reflux in the right great saphenous vein.    Review of Systems  Cardiovascular:  Positive for leg swelling.  All other systems reviewed and are negative.      Objective:   Physical Exam Vitals reviewed.  HENT:     Head: Normocephalic.  Cardiovascular:     Rate and Rhythm: Normal rate.     Pulses: Normal pulses.  Pulmonary:     Effort: Pulmonary effort is normal.  Musculoskeletal:         General: Tenderness present.     Left lower leg: Edema present.  Skin:    General: Skin is warm and dry.  Neurological:     Mental Status: She is alert and oriented to person, place, and time.  Psychiatric:        Mood and Affect: Mood normal.        Behavior: Behavior normal.        Thought Content: Thought content normal.        Judgment: Judgment normal.     BP 129/88   Pulse 73   Wt 176 lb 2 oz (79.9 kg)   BMI 28.43 kg/m   Past Medical History:  Diagnosis Date   Arthritis    arthritis in knees   Arthritis    Chronic left-sided headaches    UNCLEAR ETIOLOGY   Constipation    Family history of breast cancer    Fever blister    Folliculitis    GERD (gastroesophageal reflux disease)    Goiter    Hashimoto's disease    Heart murmur    Hypothyroidism    Increased BMI    Menopause    Plantar fasciitis    Primary osteoarthritis of left knee    Primary osteoarthritis of right knee    Vaginal atrophy     Social History   Socioeconomic History   Marital status: Married    Spouse name: Not on file   Number of children: Not on file   Years of education: Not on file   Highest education level: Not on file  Occupational  History   Not on file  Tobacco Use   Smoking status: Never   Smokeless tobacco: Never  Vaping Use   Vaping status: Never Used  Substance and Sexual Activity   Alcohol use: No   Drug use: No   Sexual activity: Yes    Birth control/protection: Other-see comments    Comment: VASECTOMY  Other Topics Concern   Not on file  Social History Narrative   Not on file   Social Drivers of Health   Financial Resource Strain: Low Risk  (06/01/2024)   Received from [facility]   Overall Financial Resource Strain (CARDIA)    Difficulty of Paying Living Expenses: Not hard at all  Food Insecurity: No Food Insecurity (06/01/2024)   Received from [facility]   Hunger Vital Sign    Within the past 12 months, you  worried that your food would run out before you got the money to buy more.: Never true    Within the past 12 months, the food you bought just didn't last and you didn't have money to get more.: Never true  Transportation Needs: No Transportation Needs (06/01/2024)   Received from [facility] - Transportation    In the past 12 months, has lack of transportation kept you from medical appointments or from getting medications?: No    Lack of Transportation (Non-Medical): No  Physical Activity: Inactive (06/06/2019)   Exercise Vital Sign    Days of Exercise per Week: 0 days    Minutes of Exercise per Session: 0 min  Stress: Not on file  Social Connections: Not on file  Intimate Partner Violence: Not At Risk (06/02/2023)   Humiliation, Afraid, Rape, and Kick questionnaire    Fear of Current or Ex-Partner: No    Emotionally Abused: No    Physically Abused: No    Sexually Abused: No    Past Surgical History:  Procedure Laterality Date   GYNECOLOGIC CRYOSURGERY     KNEE ARTHROPLASTY Right 06/02/2023   Procedure: COMPUTER ASSISTED TOTAL KNEE ARTHROPLASTY;  Surgeon: Mardee Lynwood SQUIBB, MD;  Location: [facility] ORS;  Service: Orthopedics;  Laterality: Right;   TONSILLECTOMY      Family History  Problem Relation Age of Onset   Breast cancer Mother 12   Heart disease Mother    Pancreatic cancer Father    Hypothyroidism Sister    Diabetes Neg Hx    Colon cancer Neg Hx    Ovarian cancer Neg Hx     Allergies  Allergen Reactions   Penicillin G Rash    IgE = 86 (WNL) on 05/25/2023 In high doses.  Ok to take amoxicillin        Latest Ref Rng & Units 05/25/2023   11:03 AM  CBC  WBC 4.0 - 10.5 K/uL 9.0   Hemoglobin 12.0 - 15.0 g/dL 86.6   Hematocrit 63.9 - 46.0 % 39.5   Platelets 150 - 400 K/uL 272       CMP     Component Value Date/Time   NA 138 05/25/2023 1103   K 3.8 05/25/2023 1103   CL 104 05/25/2023 1103   CO2 24 05/25/2023 1103   GLUCOSE 98 05/25/2023  1103   BUN 19 05/25/2023 1103   CREATININE 0.73 05/25/2023 1103   CALCIUM 9.4 05/25/2023 1103   PROT 7.7 05/25/2023 1103   ALBUMIN 4.2 05/25/2023 1103   AST 23 05/25/2023 1103   ALT 24 05/25/2023 1103   ALKPHOS 82  05/25/2023 1103   BILITOT 0.8 05/25/2023 1103   GFRNONAA >60 05/25/2023 1103     No results found.     Assessment & Plan:   1. Varicose veins of left lower extremity with pain (Primary) Recommend:  The patient has  persistent symptoms of pain and swelling that are having a negative impact on daily life and daily activities.  Patient should undergo injection sclerotherapy to treat the residual varicosities.  The risks, benefits and alternative therapies were reviewed in detail with the patient.  All questions were answered.  The patient agrees to proceed with sclerotherapy at their convenience.  The patient will continue wearing the graduated compression stockings and using the over-the-counter pain medications to treat her symptoms.       Current Outpatient Medications on File Prior to Visit  Medication Sig Dispense Refill   acetaminophen  (TYLENOL ) 500 MG tablet Take 1,000 mg by mouth every 8 (eight) hours as needed.     aspirin  81 MG chewable tablet Chew 1 tablet (81 mg total) by mouth 2 (two) times daily.     cyclobenzaprine  (FLEXERIL ) 10 MG tablet Take 10 mg by mouth 2 (two) times daily as needed.     HYDROcodone-ibuprofen (VICOPROFEN) 7.5-200 MG per tablet Take 1 tablet by mouth 2 (two) times daily as needed for severe pain.      levofloxacin  (LEVAQUIN ) 500 MG tablet Take 1 tablet (500 mg total) by mouth once daily for 10 days 10 tablet 0   levothyroxine  (SYNTHROID ) 75 MCG tablet Take 75 mcg by mouth every morning.     meloxicam  (MOBIC ) 7.5 MG tablet Take 1 tablet (7.5 mg total) by mouth 2 (two) times daily. 60 tablet 1   meloxicam  (MOBIC ) 7.5 MG tablet Take 1 tablet (7.5 mg total) by mouth 2 (two) times daily. 180 tablet 3   oxyCODONE  (OXY IR/ROXICODONE ) 5 MG  immediate release tablet Take 1 tablet (5 mg total) by mouth every 4 (four) hours as needed for moderate pain (pain score 4-6). 30 tablet 0   pantoprazole  (PROTONIX ) 40 MG tablet Take 1 tablet (40 mg total) by mouth daily. 90 tablet 3   pantoprazole  (PROTONIX ) 40 MG tablet Take 1 tablet (40 mg total) by mouth daily. 90 tablet 3   traMADol  (ULTRAM ) 50 MG tablet Take 1-2 tablets (50-100 mg total) by mouth every 4 (four) hours as needed for moderate pain. 30 tablet 0   valACYclovir  (VALTREX ) 1000 MG tablet Take 2 tablets by mouth 2 (two) times daily as needed (cold sores).      No current facility-administered medications on file prior to visit.    There are no Patient Instructions on file for this visit. No follow-ups on file.   Anjolaoluwa Siguenza E Catelynn Sparger, NP

## 2024-06-28 ENCOUNTER — Other Ambulatory Visit: Payer: Self-pay

## 2024-07-18 ENCOUNTER — Other Ambulatory Visit: Payer: Self-pay

## 2024-07-18 MED ORDER — TRIAMCINOLONE ACETONIDE 0.1 % EX CREA
TOPICAL_CREAM | Freq: Two times a day (BID) | CUTANEOUS | 2 refills | Status: AC
  Filled 2024-07-18: qty 30, 15d supply, fill #0

## 2024-07-18 MED ORDER — NYSTATIN 100000 UNIT/GM EX CREA
TOPICAL_CREAM | Freq: Two times a day (BID) | CUTANEOUS | 2 refills | Status: AC
  Filled 2024-07-18: qty 30, 15d supply, fill #0

## 2024-09-28 ENCOUNTER — Other Ambulatory Visit: Payer: Self-pay

## 2024-10-12 ENCOUNTER — Other Ambulatory Visit: Payer: Self-pay
